# Patient Record
Sex: Male | Born: 1980 | Race: Black or African American | Hispanic: No | Marital: Single | Smoking: Current every day smoker
Health system: Southern US, Community
[De-identification: ages and names within clinical notes are randomized; demographics above are authoritative.]

## PROBLEM LIST (undated history)

## (undated) DIAGNOSIS — F209 Schizophrenia, unspecified: Secondary | ICD-10-CM

---
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
## 2002-10-13 ENCOUNTER — Inpatient Hospital Stay (HOSPITAL_COMMUNITY): Admission: AC | Admit: 2002-10-13 | Discharge: 2002-10-14 | Payer: Self-pay

## 2002-10-13 ENCOUNTER — Encounter: Payer: Self-pay | Admitting: General Surgery

## 2003-12-16 ENCOUNTER — Emergency Department (HOSPITAL_COMMUNITY): Admission: EM | Admit: 2003-12-16 | Discharge: 2003-12-17 | Payer: Self-pay | Admitting: Emergency Medicine

## 2004-04-26 ENCOUNTER — Emergency Department (HOSPITAL_COMMUNITY): Admission: AC | Admit: 2004-04-26 | Discharge: 2004-04-26 | Payer: Self-pay | Admitting: Emergency Medicine

## 2005-04-24 ENCOUNTER — Emergency Department (HOSPITAL_COMMUNITY): Admission: EM | Admit: 2005-04-24 | Discharge: 2005-04-24 | Payer: Self-pay | Admitting: Family Medicine

## 2005-08-02 ENCOUNTER — Emergency Department (HOSPITAL_COMMUNITY): Admission: EM | Admit: 2005-08-02 | Discharge: 2005-08-02 | Payer: Self-pay | Admitting: Emergency Medicine

## 2006-04-02 ENCOUNTER — Emergency Department (HOSPITAL_COMMUNITY): Admission: EM | Admit: 2006-04-02 | Discharge: 2006-04-02 | Payer: Self-pay | Admitting: Emergency Medicine

## 2006-04-04 ENCOUNTER — Emergency Department (HOSPITAL_COMMUNITY): Admission: EM | Admit: 2006-04-04 | Discharge: 2006-04-05 | Payer: Self-pay | Admitting: Emergency Medicine

## 2006-04-07 ENCOUNTER — Emergency Department (HOSPITAL_COMMUNITY): Admission: EM | Admit: 2006-04-07 | Discharge: 2006-04-08 | Payer: Self-pay | Admitting: Emergency Medicine

## 2006-10-21 ENCOUNTER — Emergency Department (HOSPITAL_COMMUNITY): Admission: EM | Admit: 2006-10-21 | Discharge: 2006-10-22 | Payer: Self-pay | Admitting: Emergency Medicine

## 2007-10-16 ENCOUNTER — Emergency Department (HOSPITAL_COMMUNITY): Admission: EM | Admit: 2007-10-16 | Discharge: 2007-10-16 | Payer: Self-pay | Admitting: Emergency Medicine

## 2009-01-23 ENCOUNTER — Emergency Department (HOSPITAL_COMMUNITY): Admission: EM | Admit: 2009-01-23 | Discharge: 2009-01-24 | Payer: Self-pay | Admitting: Emergency Medicine

## 2009-01-26 ENCOUNTER — Emergency Department (HOSPITAL_BASED_OUTPATIENT_CLINIC_OR_DEPARTMENT_OTHER): Admission: EM | Admit: 2009-01-26 | Discharge: 2009-01-26 | Payer: Self-pay | Admitting: Emergency Medicine

## 2009-10-27 ENCOUNTER — Other Ambulatory Visit: Payer: Self-pay

## 2009-10-28 ENCOUNTER — Ambulatory Visit: Payer: Self-pay | Admitting: Psychiatry

## 2009-10-28 ENCOUNTER — Inpatient Hospital Stay (HOSPITAL_COMMUNITY): Admission: AD | Admit: 2009-10-28 | Discharge: 2009-11-01 | Payer: Self-pay | Admitting: Psychiatry

## 2009-10-28 DIAGNOSIS — F29 Unspecified psychosis not due to a substance or known physiological condition: Secondary | ICD-10-CM

## 2010-08-08 LAB — RAPID URINE DRUG SCREEN, HOSP PERFORMED
Amphetamines: NOT DETECTED
Barbiturates: NOT DETECTED
Benzodiazepines: NOT DETECTED
Cocaine: POSITIVE — AB
Opiates: NOT DETECTED
Tetrahydrocannabinol: NOT DETECTED

## 2010-08-08 LAB — DIFFERENTIAL
Basophils Absolute: 0 10*3/uL (ref 0.0–0.1)
Basophils Relative: 0 % (ref 0–1)
Eosinophils Absolute: 0.1 10*3/uL (ref 0.0–0.7)
Eosinophils Relative: 1 % (ref 0–5)
Lymphocytes Relative: 31 % (ref 12–46)
Lymphs Abs: 2 10*3/uL (ref 0.7–4.0)
Monocytes Absolute: 0.7 10*3/uL (ref 0.1–1.0)
Monocytes Relative: 11 % (ref 3–12)
Neutro Abs: 3.7 10*3/uL (ref 1.7–7.7)
Neutrophils Relative %: 58 % (ref 43–77)

## 2010-08-08 LAB — BASIC METABOLIC PANEL
BUN: 8 mg/dL (ref 6–23)
CO2: 24 mEq/L (ref 19–32)
Calcium: 9.2 mg/dL (ref 8.4–10.5)
Chloride: 107 mEq/L (ref 96–112)
Creatinine, Ser: 1.03 mg/dL (ref 0.4–1.5)
GFR calc Af Amer: >60 mL/min (ref >60)
GFR calc non Af Amer: >60 mL/min (ref >60)
Glucose, Bld: 102 mg/dL — ABNORMAL HIGH (ref 70–99)
Potassium: 3.7 mEq/L (ref 3.5–5.1)
Sodium: 137 mEq/L (ref 135–145)

## 2010-08-08 LAB — CBC
HCT: 40.8 % (ref 39.0–52.0)
Hemoglobin: 14.1 g/dL (ref 13.0–17.0)
MCHC: 34.5 g/dL (ref 30.0–36.0)
MCV: 96.6 fL (ref 78.0–100.0)
Platelets: 177 10*3/uL (ref 150–400)
RBC: 4.22 MIL/uL (ref 4.22–5.81)
RDW: 11.9 % (ref 11.5–15.5)
WBC: 6.4 10*3/uL (ref 4.0–10.5)

## 2010-08-08 LAB — TRICYCLICS SCREEN, URINE: TCA Scrn: NOT DETECTED

## 2010-08-08 LAB — TSH: TSH: 0.541 u[IU]/mL (ref 0.350–4.500)

## 2010-08-08 LAB — ETHANOL: Alcohol, Ethyl (B): <5 mg/dL (ref 0–10)

## 2010-08-26 LAB — POCT TOXICOLOGY PANEL
Cocaine: POSITIVE
Tetrahydrocannabinol: POSITIVE

## 2010-08-26 LAB — URINALYSIS, ROUTINE W REFLEX MICROSCOPIC
Glucose, UA: NEGATIVE mg/dL
Hgb urine dipstick: NEGATIVE
Ketones, ur: 40 mg/dL — AB
Nitrite: NEGATIVE
Protein, ur: NEGATIVE mg/dL
Specific Gravity, Urine: 1.033 — ABNORMAL HIGH (ref 1.005–1.030)
Urobilinogen, UA: 1 mg/dL (ref 0.0–1.0)
pH: 5.5 (ref 5.0–8.0)

## 2010-08-26 LAB — COMPREHENSIVE METABOLIC PANEL
ALT: 16 U/L (ref 0–53)
ALT: 20 U/L (ref 0–53)
AST: 29 U/L (ref 0–37)
AST: 32 U/L (ref 0–37)
Albumin: 3.9 g/dL (ref 3.5–5.2)
Albumin: 4.5 g/dL (ref 3.5–5.2)
Alkaline Phosphatase: 40 U/L (ref 39–117)
Alkaline Phosphatase: 72 U/L (ref 39–117)
BUN: 14 mg/dL (ref 6–23)
BUN: 15 mg/dL (ref 6–23)
CO2: 24 mEq/L (ref 19–32)
CO2: 27 mEq/L (ref 19–32)
Calcium: 8.7 mg/dL (ref 8.4–10.5)
Calcium: 9.7 mg/dL (ref 8.4–10.5)
Chloride: 106 mEq/L (ref 96–112)
Chloride: 106 mEq/L (ref 96–112)
Creatinine, Ser: 1.11 mg/dL (ref 0.4–1.5)
Creatinine, Ser: 1.1 mg/dL (ref 0.4–1.5)
GFR calc Af Amer: >60 mL/min (ref >60)
GFR calc Af Amer: >60 mL/min (ref >60)
GFR calc non Af Amer: >60 mL/min (ref >60)
GFR calc non Af Amer: >60 mL/min (ref >60)
Glucose, Bld: 115 mg/dL — ABNORMAL HIGH (ref 70–99)
Glucose, Bld: 90 mg/dL (ref 70–99)
Potassium: 3.4 mEq/L — ABNORMAL LOW (ref 3.5–5.1)
Potassium: 3.4 mEq/L — ABNORMAL LOW (ref 3.5–5.1)
Sodium: 137 mEq/L (ref 135–145)
Sodium: 143 mEq/L (ref 135–145)
Total Bilirubin: 0.6 mg/dL (ref 0.3–1.2)
Total Bilirubin: 0.7 mg/dL (ref 0.3–1.2)
Total Protein: 6.6 g/dL (ref 6.0–8.3)
Total Protein: 7.9 g/dL (ref 6.0–8.3)

## 2010-08-26 LAB — CBC
HCT: 45.2 % (ref 39.0–52.0)
Hemoglobin: 15.5 g/dL (ref 13.0–17.0)
MCHC: 34.2 g/dL (ref 30.0–36.0)
MCV: 96.3 fL (ref 78.0–100.0)
Platelets: 188 10*3/uL (ref 150–400)
RBC: 4.69 MIL/uL (ref 4.22–5.81)
RDW: 11.7 % (ref 11.5–15.5)
WBC: 6.6 10*3/uL (ref 4.0–10.5)

## 2010-08-26 LAB — DIFFERENTIAL
Basophils Absolute: 0.1 10*3/uL (ref 0.0–0.1)
Basophils Relative: 1 % (ref 0–1)
Eosinophils Absolute: 0.1 10*3/uL (ref 0.0–0.7)
Eosinophils Relative: 1 % (ref 0–5)
Lymphocytes Relative: 21 % (ref 12–46)
Lymphs Abs: 1.4 10*3/uL (ref 0.7–4.0)
Monocytes Absolute: 1.1 10*3/uL — ABNORMAL HIGH (ref 0.1–1.0)
Monocytes Relative: 17 % — ABNORMAL HIGH (ref 3–12)
Neutro Abs: 3.9 10*3/uL (ref 1.7–7.7)
Neutrophils Relative %: 60 % (ref 43–77)

## 2010-08-26 LAB — RAPID URINE DRUG SCREEN, HOSP PERFORMED
Amphetamines: NOT DETECTED
Barbiturates: NOT DETECTED
Benzodiazepines: NOT DETECTED
Cocaine: POSITIVE — AB
Opiates: NOT DETECTED
Tetrahydrocannabinol: POSITIVE — AB

## 2010-08-26 LAB — ETHANOL
Alcohol, Ethyl (B): <5 mg/dL (ref 0–10)
Alcohol, Ethyl (B): <5 mg/dL (ref 0–10)

## 2010-08-26 LAB — POCT CARDIAC MARKERS
CKMB, poc: <1 ng/mL — ABNORMAL LOW (ref 1.0–8.0)
Myoglobin, poc: 100 ng/mL (ref 12–200)
Troponin i, poc: <0.05 ng/mL (ref 0.00–0.09)

## 2010-08-26 LAB — ACETAMINOPHEN LEVEL: Acetaminophen (Tylenol), Serum: <10 ug/mL — ABNORMAL LOW (ref 10–30)

## 2010-08-26 LAB — URINE MICROSCOPIC-ADD ON

## 2010-08-26 LAB — SALICYLATE LEVEL: Salicylate Lvl: <4 mg/dL (ref 2.8–20.0)

## 2010-10-25 ENCOUNTER — Inpatient Hospital Stay (INDEPENDENT_AMBULATORY_CARE_PROVIDER_SITE_OTHER)
Admission: RE | Admit: 2010-10-25 | Discharge: 2010-10-25 | Disposition: A | Discharge status: Home / Self Care | Payer: Medicaid Other | Source: Ambulatory Visit | Attending: Family Medicine | Admitting: Family Medicine

## 2010-10-25 DIAGNOSIS — A64 Unspecified sexually transmitted disease: Secondary | ICD-10-CM

## 2010-10-25 LAB — POCT URINALYSIS DIP (DEVICE)
Bilirubin Urine: NEGATIVE
Glucose, UA: NEGATIVE mg/dL
Hgb urine dipstick: NEGATIVE
Ketones, ur: NEGATIVE mg/dL
Nitrite: NEGATIVE
Protein, ur: NEGATIVE mg/dL
Specific Gravity, Urine: 1.025 (ref 1.005–1.030)
Urobilinogen, UA: 0.2 mg/dL (ref 0.0–1.0)
pH: 6.5 (ref 5.0–8.0)

## 2010-10-26 LAB — GC/CHLAMYDIA PROBE AMP, GENITAL
Chlamydia, DNA Probe: POSITIVE — AB
GC Probe Amp, Genital: NEGATIVE

## 2011-02-15 LAB — COMPREHENSIVE METABOLIC PANEL
Alkaline Phosphatase: 40
BUN: 21
CO2: 25
Chloride: 100
Creatinine, Ser: 1.06
GFR calc non Af Amer: >60
Glucose, Bld: 102 — ABNORMAL HIGH
Potassium: 3.8
Total Bilirubin: 1.5 — ABNORMAL HIGH

## 2011-02-15 LAB — RAPID URINE DRUG SCREEN, HOSP PERFORMED
Amphetamines: NOT DETECTED
Benzodiazepines: NOT DETECTED
Opiates: NOT DETECTED
Tetrahydrocannabinol: POSITIVE — AB

## 2011-02-15 LAB — DIFFERENTIAL
Basophils Absolute: 0.1
Basophils Relative: 1
Lymphocytes Relative: 17
Monocytes Absolute: 1
Neutro Abs: 9.6 — ABNORMAL HIGH
Neutrophils Relative %: 75

## 2011-02-15 LAB — CBC
HCT: 45.5
Hemoglobin: 15.9
MCV: 92.8
Platelets: 197
RBC: 4.9
WBC: 12.9 — ABNORMAL HIGH

## 2011-02-15 LAB — URINALYSIS, ROUTINE W REFLEX MICROSCOPIC
Nitrite: NEGATIVE
Protein, ur: 30 — AB
Specific Gravity, Urine: 1.037 — ABNORMAL HIGH
Urobilinogen, UA: 1

## 2011-02-15 LAB — URINE MICROSCOPIC-ADD ON

## 2011-02-15 LAB — ETHANOL: Alcohol, Ethyl (B): 5

## 2011-03-09 LAB — TRICYCLICS SCREEN, URINE: TCA Scrn: NOT DETECTED

## 2011-03-09 LAB — URINALYSIS, ROUTINE W REFLEX MICROSCOPIC
Glucose, UA: NEGATIVE
Nitrite: NEGATIVE
Specific Gravity, Urine: 1.037 — ABNORMAL HIGH
pH: 5.5

## 2011-03-09 LAB — CBC
MCHC: 33.7
MCV: 93.5
Platelets: 211
RBC: 4.58
WBC: 9.7

## 2011-03-09 LAB — URINE CULTURE
Colony Count: NO GROWTH
Culture: NO GROWTH

## 2011-03-09 LAB — COMPREHENSIVE METABOLIC PANEL
ALT: 15
AST: 34
Albumin: 4.6
Calcium: 9.5
Chloride: 107
Creatinine, Ser: 1.18
GFR calc Af Amer: >60
Sodium: 141

## 2011-03-09 LAB — DIFFERENTIAL
Eosinophils Absolute: 0.1
Eosinophils Relative: 1
Lymphocytes Relative: 24
Lymphs Abs: 2.3
Monocytes Absolute: 1.1 — ABNORMAL HIGH

## 2011-03-09 LAB — RAPID URINE DRUG SCREEN, HOSP PERFORMED
Barbiturates: NOT DETECTED
Benzodiazepines: POSITIVE — AB
Cocaine: POSITIVE — AB

## 2011-03-09 LAB — URINE MICROSCOPIC-ADD ON

## 2011-06-06 ENCOUNTER — Emergency Department (HOSPITAL_COMMUNITY)
Admission: EM | Admit: 2011-06-06 | Discharge: 2011-06-06 | Disposition: A | Discharge status: Home / Self Care | Payer: Medicaid Other | Attending: Emergency Medicine | Admitting: Emergency Medicine

## 2011-06-06 ENCOUNTER — Encounter (HOSPITAL_COMMUNITY): Payer: Self-pay | Admitting: *Deleted

## 2011-06-06 DIAGNOSIS — Z202 Contact with and (suspected) exposure to infections with a predominantly sexual mode of transmission: Secondary | ICD-10-CM | POA: Insufficient documentation

## 2011-06-06 DIAGNOSIS — A630 Anogenital (venereal) warts: Secondary | ICD-10-CM | POA: Insufficient documentation
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
## 2011-06-06 NOTE — ED Notes (Signed)
States noticing white d/c from penis this am. States that girlfriend was tested positive for hpv and genital warts. Pt concerned about lesions present for several months located on groin and thigh. Denies pain.

## 2011-06-06 NOTE — ED Provider Notes (Signed)
History     CSN: [id number]  Arrival date & time 06/06/11  1335   First MD Initiated Contact with Patient 06/06/11 1614      Chief Complaint  Patient presents with  . Exposure to STD    (Consider location/radiation/quality/duration/timing/severity/associated sxs/prior treatment) Patient is a 31 y.o. male presenting with STD exposure. The history is provided by the patient. No language interpreter was used.  Exposure to STD This is a new problem. The current episode started 1 to 4 weeks ago. The problem occurs daily. The problem has been gradually worsening. The symptoms are aggravated by nothing. He has tried nothing for the symptoms.   Patient reports that he had unprotected sex with a contact that has since been diagnosed with genital herpes.  Patient has several painless lesions in pubic area that he wants evaluated. Patient denies dysuria or penile discharge. History reviewed. No pertinent past medical history.  History reviewed. No pertinent past surgical history.  No family history on file.  History  Substance Use Topics  . Smoking status: Current Everyday Smoker  . Smokeless tobacco: Not on file  . Alcohol Use: Yes     occ      Review of Systems  Genitourinary: Positive for genital sores. Negative for dysuria, frequency, discharge, scrotal swelling, penile pain and testicular pain.  All other systems reviewed and are negative.    Allergies  Review of patient's allergies indicates no known allergies.  Home Medications   Current Outpatient Rx  Name Route Sig Dispense Refill  . ARIPIPRAZOLE 10 MG PO TABS Oral Take 10 mg by mouth daily.    . CHLORPROMAZINE HCL 200 MG PO TABS Oral Take 200 mg by mouth daily as needed.       BP 132/72  Pulse 67  Temp(Src) 97.7 F (36.5 C) (Oral)  Resp 20  SpO2 97%  Physical Exam  Nursing note and vitals reviewed. Constitutional: He is oriented to person, place, and time. He appears well-developed and well-nourished.    HENT:  Head: Normocephalic.  Eyes: Conjunctivae are normal. Pupils are equal, round, and reactive to light.  Neck: Normal range of motion. Neck supple.  Cardiovascular: Normal rate, regular rhythm, normal heart sounds and intact distal pulses.   Pulmonary/Chest: Effort normal and breath sounds normal.  Abdominal: Soft. Bowel sounds are normal.  Genitourinary: Testes normal and penis normal.     Musculoskeletal: Normal range of motion.  Neurological: He is alert and oriented to person, place, and time.  Skin: Skin is warm and dry.    ED Course  Procedures (including critical care time)  Labs Reviewed - No data to display No results found.   1. Genital warts       MDM          Jimmye Norman, NP 06/06/11 2202

## 2011-06-07 NOTE — ED Provider Notes (Signed)
Medical screening examination/treatment/procedure(s) were performed by non-physician practitioner and as supervising physician I was immediately available for consultation/collaboration.   Drevion Offord W Clinton Wahlberg, MD 06/07/11 1231 

## 2011-08-28 ENCOUNTER — Encounter (HOSPITAL_COMMUNITY): Payer: Self-pay

## 2011-08-28 ENCOUNTER — Emergency Department (INDEPENDENT_AMBULATORY_CARE_PROVIDER_SITE_OTHER)
Admission: EM | Admit: 2011-08-28 | Discharge: 2011-08-28 | Disposition: A | Discharge status: Home / Self Care | Payer: Medicaid Other | Source: Home / Self Care

## 2011-08-28 DIAGNOSIS — Z711 Person with feared health complaint in whom no diagnosis is made: Secondary | ICD-10-CM

## 2011-08-28 LAB — RPR: RPR Ser Ql: NONREACTIVE

## 2011-08-28 LAB — HIV ANTIBODY (ROUTINE TESTING W REFLEX): HIV: NONREACTIVE

## 2011-08-28 NOTE — ED Notes (Signed)
Patient concerned about poss STD; "condom popped"

## 2011-08-28 NOTE — Discharge Instructions (Signed)
You will be notified if your test results are abnormal. You will be treated if your tests show that you have an infection. Always use condoms. No sexual activity until you have been notified of your test results.

## 2011-08-29 LAB — GC/CHLAMYDIA PROBE AMP, GENITAL
Chlamydia, DNA Probe: NEGATIVE
GC Probe Amp, Genital: NEGATIVE

## 2011-08-30 ENCOUNTER — Telehealth (HOSPITAL_COMMUNITY): Payer: Self-pay | Admitting: *Deleted

## 2011-08-30 NOTE — ED Notes (Signed)
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 Lab shown to [employer] PA. She said to call pt. 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 I called pt.  Pt. verified x 2 and given results except HIV. Pt. told he would have to come to the [facility] M-F [id number] with a picture ID to get that result. Pt. instructed to notify his partner if he has had unprotected sex with them, because he could have passed it to them. You can pass the virus even when you don't have an outbreak, so always practice safe sex. Get treated for each outbreak with Acyclovir or Valtrex.  Vassie Moselle 08/30/2011

## 2017-11-29 ENCOUNTER — Emergency Department (HOSPITAL_COMMUNITY)
Admission: EM | Admit: 2017-11-29 | Discharge: 2017-11-30 | Disposition: A | Discharge status: Home / Self Care | Payer: Medicaid Other | Attending: Emergency Medicine | Admitting: Emergency Medicine

## 2017-11-29 ENCOUNTER — Other Ambulatory Visit: Payer: Self-pay

## 2017-11-29 DIAGNOSIS — F209 Schizophrenia, unspecified: Secondary | ICD-10-CM | POA: Insufficient documentation

## 2017-11-29 DIAGNOSIS — R441 Visual hallucinations: Secondary | ICD-10-CM | POA: Diagnosis present

## 2017-11-29 DIAGNOSIS — F1910 Other psychoactive substance abuse, uncomplicated: Secondary | ICD-10-CM | POA: Diagnosis not present

## 2017-11-29 DIAGNOSIS — R45851 Suicidal ideations: Secondary | ICD-10-CM | POA: Diagnosis not present

## 2017-11-29 DIAGNOSIS — F1419 Cocaine abuse with unspecified cocaine-induced disorder: Secondary | ICD-10-CM | POA: Diagnosis present

## 2017-11-29 DIAGNOSIS — F17210 Nicotine dependence, cigarettes, uncomplicated: Secondary | ICD-10-CM | POA: Diagnosis not present

## 2017-11-29 LAB — URINALYSIS, ROUTINE W REFLEX MICROSCOPIC
BACTERIA UA: NONE SEEN
Bilirubin Urine: NEGATIVE
Glucose, UA: NEGATIVE mg/dL
Hgb urine dipstick: NEGATIVE
Ketones, ur: 5 mg/dL — AB
NITRITE: NEGATIVE
PH: 5 (ref 5.0–8.0)
Protein, ur: 30 mg/dL — AB
SPECIFIC GRAVITY, URINE: 1.032 — AB (ref 1.005–1.030)

## 2017-11-29 LAB — SALICYLATE LEVEL: Salicylate Lvl: <7 mg/dL (ref 2.8–30.0)

## 2017-11-29 LAB — RAPID URINE DRUG SCREEN, HOSP PERFORMED
AMPHETAMINES: NOT DETECTED
BENZODIAZEPINES: NOT DETECTED
COCAINE: POSITIVE — AB
OPIATES: NOT DETECTED
Tetrahydrocannabinol: POSITIVE — AB

## 2017-11-29 LAB — ETHANOL: Alcohol, Ethyl (B): <10 mg/dL (ref <10)

## 2017-11-29 LAB — CBC WITH DIFFERENTIAL/PLATELET
BASOS PCT: 0 %
Basophils Absolute: 0 10*3/uL (ref 0.0–0.1)
Eosinophils Absolute: 0 10*3/uL (ref 0.0–0.7)
Eosinophils Relative: 0 %
HEMATOCRIT: 43.3 % (ref 39.0–52.0)
Hemoglobin: 14.7 g/dL (ref 13.0–17.0)
LYMPHS ABS: 1.7 10*3/uL (ref 0.7–4.0)
Lymphocytes Relative: 23 %
MCH: 32.4 pg (ref 26.0–34.0)
MCHC: 33.9 g/dL (ref 30.0–36.0)
MCV: 95.4 fL (ref 78.0–100.0)
MONO ABS: 0.9 10*3/uL (ref 0.1–1.0)
MONOS PCT: 13 %
NEUTROS ABS: 4.6 10*3/uL (ref 1.7–7.7)
Neutrophils Relative %: 64 %
Platelets: 221 10*3/uL (ref 150–400)
RBC: 4.54 MIL/uL (ref 4.22–5.81)
RDW: 12.3 % (ref 11.5–15.5)
WBC: 7.3 10*3/uL (ref 4.0–10.5)

## 2017-11-29 LAB — COMPREHENSIVE METABOLIC PANEL
ALBUMIN: 4.3 g/dL (ref 3.5–5.0)
ALK PHOS: 39 U/L (ref 38–126)
ALT: 19 U/L (ref 0–44)
AST: 22 U/L (ref 15–41)
Anion gap: 9 (ref 5–15)
BILIRUBIN TOTAL: 1 mg/dL (ref 0.3–1.2)
BUN: 15 mg/dL (ref 6–20)
CO2: 25 mmol/L (ref 22–32)
Calcium: 9.3 mg/dL (ref 8.9–10.3)
Chloride: 108 mmol/L (ref 98–111)
Creatinine, Ser: 1.2 mg/dL (ref 0.61–1.24)
GFR calc Af Amer: >60 mL/min (ref >60)
GFR calc non Af Amer: >60 mL/min (ref >60)
GLUCOSE: 121 mg/dL — AB (ref 70–99)
POTASSIUM: 3.3 mmol/L — AB (ref 3.5–5.1)
Sodium: 142 mmol/L (ref 135–145)
TOTAL PROTEIN: 7.6 g/dL (ref 6.5–8.1)

## 2017-11-29 LAB — ACETAMINOPHEN LEVEL: Acetaminophen (Tylenol), Serum: <10 ug/mL — ABNORMAL LOW (ref 10–30)

## 2017-11-29 NOTE — ED Notes (Signed)
Pt to room # 43. Pt guarded, forwards little with this nurse, irritable at times. Encouragement and support provided. Dinner provided. Special checks q 15 mins in place for safety, video monitoring in place. Will continue to monitor.

## 2017-11-29 NOTE — ED Notes (Signed)
[id number] Expected date:  Expected time:  Means of arrival:  Comments: 3

## 2017-11-29 NOTE — Progress Notes (Signed)
TTS consulted with Kristopher ConnJason Berry, NP who recommends continued observation for safety and stabilization and to be reassessed in the AM by psych. EDP [name], [name], PA-C and pt's nurse [name], [name], RN have been advised of the disposition.   Princess BruinsAquicha Amaranta Mehl, MSW, LCSW Therapeutic Triage Specialist  [phone]
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
## 2017-11-29 NOTE — ED Notes (Signed)
Pt A&O x 3, guarded behavior with staff. Calm at present.  Monitoring for safety, Q 15 min checks in effect. Pt SI & HI.

## 2017-11-29 NOTE — ED Triage Notes (Signed)
Pt states he is off his medications for approx. 3-5 days. Pt admits to SI and HI. Violent towards his mother however does not give specifics. Pt admits to visual and auditory hallucinations.

## 2017-11-29 NOTE — ED Provider Notes (Addendum)
[facility]-EMERGENCY DEPT Provider Note   CSN: [id number] Arrival date & time: 11/29/17  1612     History   Chief Complaint Chief Complaint  Patient presents with  . Suicidal  . Hallucinations    HPI Kristopher Miller is a 37 y.o. male.  37 year old male presents for visual hallucinations, patient states he has schizophrenia and needs a checkup.  Patient states he normally gets his medications from [facility], unable to say the last time that he was seen at [facility] or given his medications.  Patient reports seeing "crazy shit," unable to say anything else.  Patient reports suicidal ideations with plans to hit himself.  Denies homicidal ideation, auditory hallucinations.  Denies drug or alcohol use.  No other complaints or concerns today.     No past medical history on file.  Patient Active Problem List   Diagnosis Date Noted  . 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      Home Medications    Prior to Admission medications   Medication Sig Start Date End Date Taking? Authorizing Provider  cariprazine (VRAYLAR) capsule Take 1.5 mg by mouth daily.   Yes [provider]  hydrOXYzine (ATARAX/VISTARIL) 10 MG tablet Take 10 mg by mouth 3 (three) times daily as needed for anxiety.   Yes [provider]    Family History No family history on file.  Social History Social History   Tobacco Use  . Smoking status: Current Every Day Smoker  Substance Use Topics  . Alcohol use: Yes    Comment: occ  . Drug use: Yes    Types: Marijuana     Allergies   Patient has no known allergies.   Review of Systems Review of Systems  Constitutional: Negative for fever.  Respiratory: Negative for cough.   Cardiovascular: Negative for chest pain.  Gastrointestinal: Negative for abdominal pain.  Genitourinary: Negative for difficulty urinating.  Skin: Negative for rash and wound.    Allergic/Immunologic: Negative for immunocompromised state.  Hematological: Does not bruise/bleed easily.  Psychiatric/Behavioral: Positive for hallucinations and suicidal ideas. Negative for agitation, behavioral problems and confusion.  All other systems reviewed and are negative.    Physical Exam Updated Vital Signs BP 121/70 (BP Location: Right Arm)   Pulse (!) 105   Temp 98.6 F (37 C) (Oral)   Resp 19   Ht 5\' 11"  (1.803 m)   Wt 77.1 kg (170 lb)   SpO2 96%   BMI 23.71 kg/m   Physical Exam  Constitutional: He is oriented to person, place, and time. He appears well-developed and well-nourished.  HENT:  Head: Normocephalic and atraumatic.  Eyes: Conjunctivae are normal.  Neck: Neck supple.  Cardiovascular: Normal rate, regular rhythm, normal heart sounds and intact distal pulses.  No murmur heard. Pulmonary/Chest: Effort normal and breath sounds normal. No respiratory distress.  Abdominal: Soft. He exhibits no distension. There is no tenderness.  Neurological: He is alert and oriented to person, place, and time.  Skin: Skin is warm and dry. Capillary refill takes less than 2 seconds. No rash noted.  Psychiatric: He is slowed and withdrawn. He is not actively hallucinating. He expresses suicidal ideation. He expresses no homicidal ideation. He expresses suicidal plans. He expresses no homicidal plans.  Flat affect, mumbles, poor eye contact He is attentive.  Nursing note and vitals reviewed.    ED Treatments / Results  Labs (all labs ordered are listed, but only abnormal  results are displayed) Labs Reviewed  ACETAMINOPHEN LEVEL - Abnormal; Notable for the following components:      Result Value   Acetaminophen (Tylenol), Serum <10 (*)    All other components within normal limits  COMPREHENSIVE METABOLIC PANEL - Abnormal; Notable for the following components:   Potassium 3.3 (*)    Glucose, Bld 121 (*)    All other components within normal limits  URINALYSIS,  ROUTINE W REFLEX MICROSCOPIC - Abnormal; Notable for the following components:   Color, Urine AMBER (*)    Specific Gravity, Urine 1.032 (*)    Ketones, ur 5 (*)    Protein, ur 30 (*)    Leukocytes, UA TRACE (*)    All other components within normal limits  RAPID URINE DRUG SCREEN, HOSP PERFORMED - Abnormal; Notable for the following components:   Cocaine POSITIVE (*)    Tetrahydrocannabinol POSITIVE (*)    Barbiturates   (*)    Value: Result not available. Reagent lot number recalled by manufacturer.   All other components within normal limits  ETHANOL  CBC WITH DIFFERENTIAL/PLATELET  SALICYLATE LEVEL    EKG EKG Interpretation  Date/Time:  Thursday November 29 2017 17:05:50 EDT Ventricular Rate:  73 PR Interval:  178 QRS Duration: 74 QT Interval:  380 QTC Calculation: 418 R Axis:   90 Text Interpretation:  Normal sinus rhythm Rightward axis Borderline ECG No significant change was found Confirmed by Azalia Bilisampos, Kevin ([id number]) on 11/29/2017 6:27:45 PM   Radiology No results found.  Procedures Procedures (including critical care time)  Medications Ordered in ED Medications  OLANZapine zydis (ZYPREXA) disintegrating tablet 5 mg (5 mg Oral Given 11/30/17 0426)  hydrOXYzine (ATARAX/VISTARIL) tablet 50 mg (50 mg Oral Given 11/30/17 0426)  LORazepam (ATIVAN) tablet 2 mg (2 mg Oral Given 11/30/17 0757)     Initial Impression / Assessment and Plan / ED Course  I have reviewed the triage vital signs and the nursing notes.  Pertinent labs & imaging results that were available during my care of the patient were reviewed by me and considered in my medical decision making (see chart for details).  Clinical Course as of Dec 01 2149  Thu Nov 29, 2017  1826 [facility]   [LM]  2106 Case discussed with behavioral health who plans to overnight observation and recheck in the morning.  Patient is not involuntarily committed, denied SI to behavioral health  provider, SI report to me during his history involved plan to hit himself.  Patient is agreeable to stay overnight.   [LM]    Clinical Course User Index [LM] [name], [name], PA-C   Final Clinical Impressions(s) / ED Diagnoses   Final diagnoses:  Polysubstance abuse (HCC)  Suicidal ideation  Visual hallucinations  Cocaine abuse with cocaine-induced disorder [facility](HCC)    ED Discharge Orders    None       [name], [name], PA-C 11/29/17 1827    [name], [name], PA-C 12/01/17 2151    Azalia Bilisampos, Kevin, MD 12/01/17 [phone]

## 2017-11-30 DIAGNOSIS — F1419 Cocaine abuse with unspecified cocaine-induced disorder: Secondary | ICD-10-CM | POA: Diagnosis present

## 2017-11-30 MED ORDER — OLANZAPINE 5 MG PO TBDP
5.0000 mg | ORAL_TABLET | Freq: Once | ORAL | Status: AC
  Administered 2017-11-30: 5 mg via ORAL
  Filled 2017-11-30: qty 1

## 2017-11-30 MED ORDER — COLCHICINE 0.6 MG PO TABS: 0.6000 mg | ORAL_TABLET | Freq: Once | ORAL | Status: DC

## 2017-11-30 MED ORDER — LORAZEPAM 1 MG PO TABS
2.0000 mg | ORAL_TABLET | Freq: Once | ORAL | Status: AC
  Administered 2017-11-30: 2 mg via ORAL
  Filled 2017-11-30: qty 2

## 2017-11-30 MED ORDER — HYDROXYZINE HCL 25 MG PO TABS
50.0000 mg | ORAL_TABLET | Freq: Once | ORAL | Status: AC
  Administered 2017-11-30: 50 mg via ORAL
  Filled 2017-11-30: qty 2

## 2017-11-30 NOTE — BH Assessment (Signed)
[facility] Assessment Progress Note  Per Kristopher QuarryHiren Umrania, MD, this pt does not require psychiatric hospitalization at this time.  [facility]  This has been included in pt's discharge instructions.  Pt would also benefit from seeing Peer Support Specialists; they will be asked to speak to pt.  Pt's nurse, Kristopher Sheldonshley, has been notified.  Kristopher Canninghomas Jaylenn Altier, MA Triage Specialist [phone]

## 2017-11-30 NOTE — ED Notes (Signed)
Pt continues to push at exit doors, sitting on floor of bathroom, redirected back to room.  Asked pt if he needed something for sleep and he stated yes he will take pills.  PA Nira ConnJason Berry called for orders.

## 2017-11-30 NOTE — Discharge Instructions (Signed)
For your mental health needs, you are advised to follow up with [facility].  New and returning patients are seen at their walk-in clinic.  Walk-in hours are Monday - Friday from 8:00 am - 3:00 pm.  Walk-in patients are seen on a first come, first served basis.  Try to arrive as early as possible for he best chance of being seen the same day: ° °     [facility] °     [address] °     [place], [place] [address] °     [phone] °

## 2017-11-30 NOTE — ED Notes (Signed)
Pt d/c home per MD order. Discharge summary reviewed with pt. Pt verbalizes understanding. Not endorsing SI/HI. Bus pass provided. Personal property returned. Pt signed e-signature. Ambulatory off unit with [facility].

## 2017-11-30 NOTE — ED Notes (Signed)
Pt attempting to exit out of locked doors, redirected back to room #43.

## 2017-11-30 NOTE — ED Notes (Signed)
Pt behavior bizarre, crawling on floor, anxious, requesting medication to help "head" this nurse notified EDP. Awaiting orders.

## 2017-11-30 NOTE — Consult Note (Signed)
[facility] Psych ED Discharge  11/30/2017 1:16 PM Kristopher Miller  MRN:  [id number] Principal Problem: Cocaine abuse with cocaine-induced disorder [facility]) Discharge Diagnoses:  Patient Active Problem List   Diagnosis Date Noted  . Cocaine abuse with cocaine-induced disorder [facility]) [F14.19] 11/30/2017    Subjective: Pt was seen and chart reviewed with treatment team and Dr Jerold Coombe. Pt denies suicidal/homicidal ideation, denies auditory/visual hallucinations and does not appear to be responding to internal stimuli. Pt's UDS positive for cocaine and THC, BAL negative. Pt stated he used cocaine and his cousin kicked him out of the house so he came to the [facility]. Pt was groggy and mumbling so he was difficult to understand. Pt had been crawling on the floor so he was medicated prior to rounds this AM. Pt stated he takes Vistaril for his anxiety and he is followed at [facility]. Pt's labs are unremarkable. Pt's EKG reviewed and is WNL. No other diagnostic tests were performed. Pt currently has a court date on 12-03-2017 and he stated he knew about this and needed to be there. Pt has no history with this hospital since 2013. Pt does have a prison record and was incarcerated during the last few years. Pt is stable and psychiatrically clear for discharge.   Total Time spent with patient: 45 minutes  Past Psychiatric History: As above  Past Medical History: No past medical history on file. No past surgical history on file. Family History: No family history on file. Family Psychiatric  History: Unknown Social History:  Social History   Substance and Sexual Activity  Alcohol Use Yes   Comment: occ    Social History   Substance and Sexual Activity  Drug Use Yes  . Types: Marijuana   Social History   Socioeconomic History  . Marital status: Single    Spouse name: Not on file  . Number of children: Not on file  . Years of education: Not on file  . Highest education level: Not on file  Occupational History   . Not on file  Social Needs  . Financial resource strain: Not on file  . Food insecurity:    Worry: Not on file    Inability: Not on file  . Transportation needs:    Medical: Not on file    Non-medical: Not on file  Tobacco Use  . Smoking status: Current Every Day Smoker  Substance and Sexual Activity  . Alcohol use: Yes    Comment: occ  . Drug use: Yes    Types: Marijuana  . Sexual activity: Not on file  Lifestyle  . Physical activity:    Days per week: Not on file    Minutes per session: Not on file  . Stress: Not on file  Relationships  . Social connections:    Talks on phone: Not on file    Gets together: Not on file    Attends religious service: Not on file    Active member of club or organization: Not on file    Attends meetings of clubs or organizations: Not on file    Relationship status: Not on file  Other Topics Concern  . Not on file  Social History Narrative  . Not on file    Has this patient used any form of tobacco in the last 30 days? (Cigarettes, Smokeless Tobacco, Cigars, and/or Pipes) Prescription not provided because: Pt declined  Current Medications: No current facility-administered medications for this encounter.    Current Outpatient Medications  Medication Sig Dispense  Refill  . cariprazine (VRAYLAR) capsule Take 1.5 mg by mouth daily.    . hydrOXYzine (ATARAX/VISTARIL) 10 MG tablet Take 10 mg by mouth 3 (three) times daily as needed for anxiety.      Musculoskeletal: Strength & Muscle Tone: within normal limits Gait & Station: normal Patient leans: N/A  Psychiatric Specialty Exam: Physical Exam  Constitutional: He appears well-developed and well-nourished.  Respiratory: Effort normal.  Musculoskeletal: Normal range of motion.  Neurological: He is alert.  Psychiatric: He has a normal mood and affect. Thought content normal. His speech is slurred. He is slowed. Cognition and memory are normal. He expresses impulsivity.    Review of  Systems  Psychiatric/Behavioral: Positive for substance abuse. Negative for depression, hallucinations, memory loss and suicidal ideas. The patient is not nervous/anxious and does not have insomnia.   All other systems reviewed and are negative.   Blood pressure 116/64, pulse 65, temperature 98.2 F (36.8 C), temperature source Oral, resp. rate 14, height 5\' 11"  (1.803 m), weight 170 lb (77.1 kg), SpO2 97 %.Body mass index is 23.71 kg/m.  General Appearance: Casual  Eye Contact:  Poor  Speech:  Slow and Slurred  Volume:  Decreased  Mood:  Depressed and sleepy  Affect:  Congruent and Depressed  Thought Process:  Coherent, Linear and Descriptions of Associations: Intact  Orientation:  Full (Time, Place, and Person)  Thought Content:  Logical  Suicidal Thoughts:  No  Homicidal Thoughts:  No  Memory:  Immediate;   Good Recent;   Good Remote;   Fair  Judgement:  Fair  Insight:  Lacking  Psychomotor Activity:  Decreased  Concentration:  Concentration: Good and Attention Span: Good  Recall:  Good  Fund of Knowledge:  Good  Language:  Good  Akathisia:  No  Handed:  Right  AIMS (if indicated):     Assets:  ArchitectCommunication Skills Financial Resources/Insurance Housing  ADL's:  Intact  Cognition:  WNL  Sleep:        Demographic Factors:  Male, Low socioeconomic status and Unemployed  Loss Factors: Legal issues and Financial problems/change in socioeconomic status  Historical Factors: Impulsivity  Risk Reduction Factors:   Sense of responsibility to family and Living with another person, especially a relative  Continued Clinical Symptoms:  Depression:   Impulsivity Alcohol/Substance Abuse/Dependencies  Cognitive Features That Contribute To Risk:  Closed-mindedness    Suicide Risk:  Minimal: No identifiable suicidal ideation.  Patients presenting with no risk factors but with morbid ruminations; may be classified as minimal risk based on the severity of the depressive  symptoms    Plan Of Care/Follow-up recommendations:  Activity:  as tolerated Diet:  Heart Healthy  Disposition: Discharge Home [facility] your medication and therapy needs.  Chart reviewed Labs and test reviewed, unremarkable No new medications started in the emergency room Avoid the use of alcohol and illicit drugs.  Laveda AbbeLaurie Britton Autry Prust, NP 11/30/2017, 1:16 PM

## 2017-12-03 ENCOUNTER — Other Ambulatory Visit: Payer: Self-pay

## 2017-12-03 ENCOUNTER — Encounter (HOSPITAL_COMMUNITY): Payer: Self-pay | Admitting: Emergency Medicine

## 2017-12-03 ENCOUNTER — Emergency Department (HOSPITAL_COMMUNITY)
Admission: EM | Admit: 2017-12-03 | Discharge: 2017-12-04 | Disposition: A | Discharge status: Home / Self Care | Payer: Medicaid Other | Attending: Emergency Medicine | Admitting: Emergency Medicine

## 2017-12-03 DIAGNOSIS — F1410 Cocaine abuse, uncomplicated: Secondary | ICD-10-CM | POA: Diagnosis not present

## 2017-12-03 DIAGNOSIS — F4310 Post-traumatic stress disorder, unspecified: Secondary | ICD-10-CM | POA: Diagnosis not present

## 2017-12-03 DIAGNOSIS — Z79899 Other long term (current) drug therapy: Secondary | ICD-10-CM | POA: Insufficient documentation

## 2017-12-03 DIAGNOSIS — F1220 Cannabis dependence, uncomplicated: Secondary | ICD-10-CM | POA: Diagnosis not present

## 2017-12-03 DIAGNOSIS — F329 Major depressive disorder, single episode, unspecified: Secondary | ICD-10-CM | POA: Diagnosis present

## 2017-12-03 DIAGNOSIS — F251 Schizoaffective disorder, depressive type: Secondary | ICD-10-CM | POA: Insufficient documentation

## 2017-12-03 DIAGNOSIS — F1419 Cocaine abuse with unspecified cocaine-induced disorder: Secondary | ICD-10-CM

## 2017-12-03 DIAGNOSIS — F17200 Nicotine dependence, unspecified, uncomplicated: Secondary | ICD-10-CM | POA: Diagnosis not present

## 2017-12-03 DIAGNOSIS — Z008 Encounter for other general examination: Secondary | ICD-10-CM | POA: Diagnosis not present

## 2017-12-03 LAB — COMPREHENSIVE METABOLIC PANEL
ALBUMIN: 4.1 g/dL (ref 3.5–5.0)
ALK PHOS: 41 U/L (ref 38–126)
ALT: 20 U/L (ref 0–44)
AST: 26 U/L (ref 15–41)
Anion gap: 7 (ref 5–15)
BILIRUBIN TOTAL: 0.8 mg/dL (ref 0.3–1.2)
BUN: 16 mg/dL (ref 6–20)
CO2: 27 mmol/L (ref 22–32)
CREATININE: 1.18 mg/dL (ref 0.61–1.24)
Calcium: 9.1 mg/dL (ref 8.9–10.3)
Chloride: 109 mmol/L (ref 98–111)
GFR calc Af Amer: >60 mL/min (ref >60)
GFR calc non Af Amer: >60 mL/min (ref >60)
GLUCOSE: 130 mg/dL — AB (ref 70–99)
Potassium: 3.6 mmol/L (ref 3.5–5.1)
Sodium: 143 mmol/L (ref 135–145)
TOTAL PROTEIN: 7.2 g/dL (ref 6.5–8.1)

## 2017-12-03 LAB — CBC
HEMATOCRIT: 42.2 % (ref 39.0–52.0)
HEMOGLOBIN: 14.2 g/dL (ref 13.0–17.0)
MCH: 32.3 pg (ref 26.0–34.0)
MCHC: 33.6 g/dL (ref 30.0–36.0)
MCV: 95.9 fL (ref 78.0–100.0)
Platelets: 229 10*3/uL (ref 150–400)
RBC: 4.4 MIL/uL (ref 4.22–5.81)
RDW: 12.1 % (ref 11.5–15.5)
WBC: 6.2 10*3/uL (ref 4.0–10.5)

## 2017-12-03 LAB — SALICYLATE LEVEL: Salicylate Lvl: <7 mg/dL (ref 2.8–30.0)

## 2017-12-03 LAB — ETHANOL: Alcohol, Ethyl (B): <10 mg/dL (ref <10)

## 2017-12-03 LAB — RAPID URINE DRUG SCREEN, HOSP PERFORMED
Amphetamines: NOT DETECTED
Benzodiazepines: NOT DETECTED
Cocaine: POSITIVE — AB
Opiates: NOT DETECTED
Tetrahydrocannabinol: POSITIVE — AB

## 2017-12-03 LAB — ACETAMINOPHEN LEVEL: Acetaminophen (Tylenol), Serum: <10 ug/mL — ABNORMAL LOW (ref 10–30)

## 2017-12-03 MED ORDER — HYDROXYZINE HCL 10 MG PO TABS: 10.0000 mg | ORAL_TABLET | Freq: Three times a day (TID) | ORAL | Status: DC | PRN

## 2017-12-03 MED ORDER — NICOTINE 21 MG/24HR TD PT24
21.0000 mg | MEDICATED_PATCH | Freq: Every day | TRANSDERMAL | Status: DC
  Administered 2017-12-03: 21 mg via TRANSDERMAL
  Filled 2017-12-03: qty 1

## 2017-12-03 MED ORDER — ONDANSETRON HCL 4 MG PO TABS: 4.0000 mg | ORAL_TABLET | Freq: Three times a day (TID) | ORAL | Status: DC | PRN

## 2017-12-03 MED ORDER — HYDROXYZINE HCL 25 MG PO TABS
50.0000 mg | ORAL_TABLET | Freq: Once | ORAL | Status: AC
  Administered 2017-12-03: 50 mg via ORAL
  Filled 2017-12-03: qty 2

## 2017-12-03 MED ORDER — CARIPRAZINE HCL 1.5 MG PO CAPS
1.5000 mg | ORAL_CAPSULE | Freq: Every day | ORAL | Status: DC
  Administered 2017-12-03: 1.5 mg via ORAL
  Filled 2017-12-03 (×3): qty 1

## 2017-12-03 MED ORDER — OLANZAPINE 10 MG PO TABS
10.0000 mg | ORAL_TABLET | Freq: Once | ORAL | Status: AC
  Administered 2017-12-03: 10 mg via ORAL
  Filled 2017-12-03: qty 1

## 2017-12-03 MED ORDER — ACETAMINOPHEN 325 MG PO TABS: 650.0000 mg | ORAL_TABLET | ORAL | Status: DC | PRN

## 2017-12-03 NOTE — ED Notes (Addendum)
BELONGINGS IN LOCKER 29; CLOTHING, RED SNEAKERS, NEWPORTS, 2 LIGHTERS, LOTTERY TICKET, VRAYLAR BOTTLE AND BOTTLE OF HYDROXYZINE (PER PT).

## 2017-12-03 NOTE — ED Notes (Signed)
Pt A&O x 3, anxious and agitated, pacing.  Presents with Paranoia and SI.  Denies HI.  Monitoring for safety, Q 15 min checks in effect, no distress noted.  Pt offered PO meds for agitation,  Security and [employer] at bedside for assist.

## 2017-12-03 NOTE — ED Provider Notes (Signed)
[facility]-EMERGENCY DEPT Provider Note   CSN: [id number] Arrival date & time: 12/03/17  1621     History   Chief Complaint Chief Complaint  Patient presents with  . Medical Clearance    HPI Kristopher Miller is a 37 y.o. male with a history of cocaine abuse, reported recent history of diagnosis of schizophrenia who presents emergency department today for medical clearance.  Patient was reported to leave [facility] today after being admitted for suicidal ideation.  He reports that he has been having difficulty with family and he feels they are after him.  He reports he has been feeling down and at times wants to walk in front of a car to end things.  He reports that he does have homicidal ideation and since that if someone has come after him he would harm them.  He admits to cocaine use and marijuana use but nothing recently.  He notes some alcohol use yesterday.  He denies chronic alcohol use or daily alcohol use.  He denies any attempts of self-harm.  He denies any physical complaints at this time.  HPI  History reviewed. No pertinent past medical history.  Patient Active Problem List   Diagnosis Date Noted  . Cocaine abuse with cocaine-induced disorder (HCC) 11/30/2017    History reviewed. No pertinent surgical history.      Home Medications    Prior to Admission medications   Medication Sig Start Date End Date Taking? Authorizing Provider  cariprazine (VRAYLAR) capsule Take 1.5 mg by mouth at bedtime.   Yes [provider]  hydrOXYzine (ATARAX/VISTARIL) 10 MG tablet Take 10 mg by mouth 3 (three) times daily as needed for anxiety.   Yes [provider]    Family History History reviewed. No pertinent family history.  Social History Social History   Tobacco Use  . Smoking status: Current Every Day Smoker  . Smokeless tobacco: Never Used  Substance Use Topics  . Alcohol use: Yes    Comment: occ  . Drug use: Yes   Types: Marijuana     Allergies   Patient has no known allergies.   Review of Systems Review of Systems  All other systems reviewed and are negative.    Physical Exam Updated Vital Signs BP 130/84 (BP Location: Right Arm)   Temp 98.6 F (37 C) (Oral)   Resp 19   SpO2 100%   Physical Exam  Constitutional: He appears well-developed and well-nourished.  HENT:  Head: Normocephalic and atraumatic.  Right Ear: External ear normal.  Left Ear: External ear normal.  Nose: Nose normal.  Mouth/Throat: Uvula is midline, oropharynx is clear and moist and mucous membranes are normal. No tonsillar exudate.  Eyes: Pupils are equal, round, and reactive to light. Right eye exhibits no discharge. Left eye exhibits no discharge. No scleral icterus.  Neck: Trachea normal. Neck supple. No spinous process tenderness present. No neck rigidity. Normal range of motion present.  Cardiovascular: Normal rate, regular rhythm and intact distal pulses.  No murmur heard. Pulses:      Radial pulses are 2+ on the right side, and 2+ on the left side.       Dorsalis pedis pulses are 2+ on the right side, and 2+ on the left side.       Posterior tibial pulses are 2+ on the right side, and 2+ on the left side.  No lower extremity swelling or edema. Calves symmetric in size bilaterally.  Pulmonary/Chest: Effort normal and breath  sounds normal. He exhibits no tenderness.  Abdominal: Soft. Bowel sounds are normal. There is no tenderness. There is no rigidity, no rebound, no guarding and no CVA tenderness.  Musculoskeletal: He exhibits no edema.  Lymphadenopathy:    He has no cervical adenopathy.  Neurological: He is alert.  Skin: Skin is warm and dry. No rash noted. He is not diaphoretic.  Psychiatric: He has a normal mood and affect.  Nursing note and vitals reviewed.    ED Treatments / Results  Labs (all labs ordered are listed, but only abnormal results are displayed) Labs Reviewed  COMPREHENSIVE  METABOLIC PANEL - Abnormal; Notable for the following components:      Result Value   Glucose, Bld 130 (*)    All other components within normal limits  ACETAMINOPHEN LEVEL - Abnormal; Notable for the following components:   Acetaminophen (Tylenol), Serum <10 (*)    All other components within normal limits  RAPID URINE DRUG SCREEN, HOSP PERFORMED - Abnormal; Notable for the following components:   Cocaine POSITIVE (*)    Tetrahydrocannabinol POSITIVE (*)    Barbiturates   (*)    Value: Result not available. Reagent lot number recalled by manufacturer.   All other components within normal limits  ETHANOL  SALICYLATE LEVEL  CBC    EKG None  Radiology No results found.  Procedures Procedures (including critical care time)  Medications Ordered in ED Medications  nicotine (NICODERM CQ - dosed in mg/24 hours) patch 21 mg (21 mg Transdermal Patch Applied 12/03/17 1844)  acetaminophen (TYLENOL) tablet 650 mg (has no administration in time range)  ondansetron (ZOFRAN) tablet 4 mg (has no administration in time range)  cariprazine (VRAYLAR) capsule 1.5 mg (1.5 mg Oral Given 12/03/17 2036)  hydrOXYzine (ATARAX/VISTARIL) tablet 10 mg (has no administration in time range)  OLANZapine (ZYPREXA) tablet 10 mg (10 mg Oral Given 12/03/17 2036)  hydrOXYzine (ATARAX/VISTARIL) tablet 50 mg (50 mg Oral Given 12/03/17 2036)     Initial Impression / Assessment and Plan / ED Course  I have reviewed the triage vital signs and the nursing notes.  Pertinent labs & imaging results that were available during my care of the patient were reviewed by me and considered in my medical decision making (see chart for details).     37 y.o. male presenting with Si with a plan. He reports thoughts that others are out to get him and he admits to thoughts of harming others.  He denies any auditory or visual hallucinations.  Patient vital signs are reassuring on presentation.  He denies any physical complaints at  this time.  Exam is reassuring as well.  He denies any alcohol, drug or overdose today.  His lab work is reassuring.  Patient is medically cleared.  Home medications reordered.  Patient placed in psych hold.  TTS consulted.  TTS recommends overnight observation with reassessment in the morning.  Final Clinical Impressions(s) / ED Diagnoses   Final diagnoses:  Encounter for medical clearance for patient hold    ED Discharge Orders    None       Princella PellegriniMaczis, Paislynn Hegstrom M, PA-C 12/03/17 2319    Tegeler, Canary Brimhristopher J, MD 12/04/17 [phone]
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
## 2017-12-03 NOTE — ED Notes (Signed)
Pt punching walls and door, very paranoid, anxious, [facility] about [facility] his face he states.  Security and [employer] at bedside for assistance.  PA Nira ConnJason Berry called for orders.

## 2017-12-03 NOTE — ED Notes (Signed)
TTS assessment in progress. 

## 2017-12-03 NOTE — ED Notes (Signed)
[id number] Expected date:  Expected time:  Means of arrival:  Comments: rm 29
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
## 2017-12-04 DIAGNOSIS — F1419 Cocaine abuse with unspecified cocaine-induced disorder: Secondary | ICD-10-CM

## 2017-12-04 NOTE — [facility] Suicide Risk Assessment (Signed)
Suicide Risk Assessment  Discharge Assessment   [facility]   Principal Problem: Cocaine abuse with cocaine-induced disorder [facility](HCC) Discharge Diagnoses:  Patient Active Problem List   Diagnosis Date Noted  . Cocaine abuse with cocaine-induced disorder [facility](HCC) [F14.19] 11/30/2017    Priority: High    Total Time spent with patient: 45 minutes   Musculoskeletal: Strength & Muscle Tone: within normal limits Gait & Station: normal Patient leans: N/A  Psychiatric Specialty Exam: Physical Exam  Nursing note and vitals reviewed. Constitutional: He is oriented to person, place, and time. He appears well-developed and well-nourished.  HENT:  Head: Normocephalic.  Neck: Normal range of motion.  Respiratory: Effort normal.  Musculoskeletal: Normal range of motion.  Neurological: He is alert and oriented to person, place, and time.  Psychiatric: His speech is normal. Judgment and thought content normal. His mood appears anxious. He is agitated. Cognition and memory are normal.    Review of Systems  Psychiatric/Behavioral: Positive for substance abuse. The patient is nervous/anxious.   All other systems reviewed and are negative.   Blood pressure (!) 134/98, pulse (!) 52, temperature 98 F (36.7 C), temperature source Oral, resp. rate 16, height 5\' 10"  (1.778 m), weight 77.1 kg (170 lb), SpO2 99 %.Body mass index is 24.39 kg/m.  General Appearance: Casual  Eye Contact:  Good  Speech:  Normal Rate  Volume:  Normal  Mood:  Angry and Irritable  Affect:  Congruent  Thought Process:  Coherent and Descriptions of Associations: Intact  Orientation:  Full (Time, Place, and Person)  Thought Content:  WDL and Logical  Suicidal Thoughts:  No  Homicidal Thoughts:  No  Memory:  Immediate;   Good Recent;   Good Remote;   Good  Judgement:  Fair  Insight:  Fair  Psychomotor Activity:  Normal  Concentration:  Concentration: Good and Attention Span: Good  Recall:   Good  Fund of Knowledge:  Fair  Language:  Good  Akathisia:  No  Handed:  Right  AIMS (if indicated):     Assets:  Leisure Time Physical Health Resilience Social Support  ADL's:  Intact  Cognition:  WNL  Sleep:      Mental Status Per Nursing Assessment::   On Admission:    cocaine abuse with suicidal ideations  Demographic Factors:  Male  Loss Factors: Legal issues  Historical Factors: NA  Risk Reduction Factors:   Sense of responsibility to family, Living with another person, especially a relative, Positive social support and Positive therapeutic relationship  Continued Clinical Symptoms:  Irritable and angry  Cognitive Features That Contribute To Risk:  None    Suicide Risk:  Minimal: No identifiable suicidal ideation.  Patients presenting with no risk factors but with morbid ruminations; may be classified as minimal risk based on the severity of the depressive symptoms    Plan Of Care/Follow-up recommendations:  Activity:  as tolerated Diet:  heart healthy diet  Aleesa Sweigert, NP 12/04/2017, 11:31 AM
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
## 2017-12-04 NOTE — ED Notes (Signed)
Security and [employer] required to discharge pt.

## 2017-12-04 NOTE — ED Notes (Signed)
Pt discharged. All belongings returned to pt.

## 2017-12-04 NOTE — Consult Note (Addendum)
[facility] Face-to-Face Psychiatry Consult   Reason for Consult:  Cocaine abuse with suicidal ideations Referring Physician:  EDP Patient Identification: Kristopher Miller MRN:  [id number] Principal Diagnosis: Cocaine abuse with cocaine-induced disorder [facility]) Diagnosis:   Patient Active Problem List   Diagnosis Date Noted  . Cocaine abuse with cocaine-induced disorder [facility]) [F14.19] 11/30/2017    Priority: High    Total Time spent with patient: 45 minutes  Subjective:   Kristopher Miller is a 37 y.o. male patient does not warrant admission.  HPI:  37 yo male who was using cocaine when he started having suicidal ideations.  He reports some depression related to family issues and legal charges.  On assessment, he denies suicidal/homicidal ideations, hallucinations, and withdrawal symptoms.  He would like to meet with peer support.  After leaving his room, he flipped his table and demanded paper work so he could present it to court since he missed his court date yesterday.  Stable for discharge, escorted out by police.  Past Psychiatric History: substance abuse  Risk to self: None Risk to Others: None Prior Inpatient Therapy: Prior Inpatient Therapy: Yes Prior Therapy Dates: 2011 Prior Therapy Facilty/Provider(s): multiple, [facility], [facility] Reason for Treatment: SCHIZOPHRENIA Prior Outpatient Therapy: Prior Outpatient Therapy: Yes Prior Therapy Dates: CURRENT Prior Therapy Facilty/Provider(s): [place] Reason for Treatment: MED MANAGEMENT Does patient have an ACCT team?: No Does patient have Intensive In-House Services?  : No Does patient have [facility] services? : Yes Does patient have P4CC services?: No  Past Medical History: History reviewed. No pertinent past medical history. History reviewed. No pertinent surgical history. Family History: History reviewed. No pertinent family history. Family Psychiatric  History: none Social History:  Social History   Substance and  Sexual Activity  Alcohol Use Yes   Comment: occ     Social History   Substance and Sexual Activity  Drug Use Yes  . Types: Marijuana    Social History   Socioeconomic History  . Marital status: Single    Spouse name: Not on file  . Number of children: Not on file  . Years of education: Not on file  . Highest education level: Not on file  Occupational History  . Not on file  Social Needs  . Financial resource strain: Not on file  . Food insecurity:    Worry: Not on file    Inability: Not on file  . Transportation needs:    Medical: Not on file    Non-medical: Not on file  Tobacco Use  . Smoking status: Current Every Day Smoker  . Smokeless tobacco: Never Used  Substance and Sexual Activity  . Alcohol use: Yes    Comment: occ  . Drug use: Yes    Types: Marijuana  . Sexual activity: Not on file  Lifestyle  . Physical activity:    Days per week: Not on file    Minutes per session: Not on file  . Stress: Not on file  Relationships  . Social connections:    Talks on phone: Not on file    Gets together: Not on file    Attends religious service: Not on file    Active member of club or organization: Not on file    Attends meetings of clubs or organizations: Not on file    Relationship status: Not on file  Other Topics Concern  . Not on file  Social History Narrative  . Not on file   Additional Social History: N/A  Allergies:  No Known Allergies  Labs:  Results for orders placed or performed during the hospital encounter of 12/03/17 (from the past 48 hour(s))  Rapid urine drug screen (hospital performed)     Status: Abnormal   Collection Time: 12/03/17  4:42 PM  Result Value Ref Range   Opiates NONE DETECTED NONE DETECTED   Cocaine POSITIVE (A) NONE DETECTED   Benzodiazepines NONE DETECTED NONE DETECTED   Amphetamines NONE DETECTED NONE DETECTED   Tetrahydrocannabinol POSITIVE (A) NONE DETECTED   Barbiturates (A) NONE DETECTED    Result not available.  Reagent lot number recalled by manufacturer.    Comment: Performed at [facility], [place] [address], [place], [place] [address]  Comprehensive metabolic panel     Status: Abnormal   Collection Time: 12/03/17  5:17 PM  Result Value Ref Range   Sodium 143 135 - 145 mmol/L   Potassium 3.6 3.5 - 5.1 mmol/L   Chloride 109 98 - 111 mmol/L    Comment: Please note change in reference range.   CO2 27 22 - 32 mmol/L   Glucose, Bld 130 (H) 70 - 99 mg/dL    Comment: Please note change in reference range.   BUN 16 6 - 20 mg/dL    Comment: Please note change in reference range.   Creatinine, Ser 1.18 0.61 - 1.24 mg/dL   Calcium 9.1 8.9 - 10.3 mg/dL   Total Protein 7.2 6.5 - 8.1 g/dL   Albumin 4.1 3.5 - 5.0 g/dL   AST 26 15 - 41 U/L   ALT 20 0 - 44 U/L    Comment: Please note change in reference range.   Alkaline Phosphatase 41 38 - 126 U/L   Total Bilirubin 0.8 0.3 - 1.2 mg/dL   GFR calc non Af Amer >60 >60 mL/min   GFR calc Af Amer >60 >60 mL/min    Comment: (NOTE) The eGFR has been calculated using the CKD EPI equation. This calculation has not been validated in all clinical situations. eGFR's persistently <60 mL/min signify possible Chronic Kidney Disease.    Anion gap 7 5 - 15    Comment: Performed at [facility], [place] [address] [place], [place] [address]  Ethanol     Status: None   Collection Time: 12/03/17  5:17 PM  Result Value Ref Range   Alcohol, Ethyl (B) <10 <10 mg/dL    Comment: (NOTE) Lowest detectable limit for serum alcohol is 10 mg/dL. For medical purposes only. Performed at [facility], [place] [address], [place], [place] [address]   Salicylate level     Status: None   Collection Time: 12/03/17  5:17 PM  Result Value Ref Range   Salicylate Lvl <0.3 2.8 - 30.0 mg/dL    Comment: Performed at [facility], [place] [address], [place], [place] [address]  Acetaminophen level     Status: Abnormal    Collection Time: 12/03/17  5:17 PM  Result Value Ref Range   Acetaminophen (Tylenol), Serum <10 (L) 10 - 30 ug/mL    Comment: (NOTE) Therapeutic concentrations vary significantly. A range of 10-30 ug/mL  may be an effective concentration for many patients. However, some  are best treated at concentrations outside of this range. Acetaminophen concentrations >150 ug/mL at 4 hours after ingestion  and >50 ug/mL at 12 hours after ingestion are often associated with  toxic reactions. Performed at [facility], [place] [address], [place], [place] [address]  cbc     Status: None   Collection Time: 12/03/17  5:17 PM  Result Value Ref Range   WBC 6.2 4.0 - 10.5 K/uL   RBC 4.40 4.22 - 5.81 MIL/uL   Hemoglobin 14.2 13.0 - 17.0 g/dL   HCT 42.2 39.0 - 52.0 %   MCV 95.9 78.0 - 100.0 fL   MCH 32.3 26.0 - 34.0 pg   MCHC 33.6 30.0 - 36.0 g/dL   RDW 12.1 11.5 - 15.5 %   Platelets 229 150 - 400 K/uL    Comment: Performed at [facility], [place] [address] [place], [place] [address]    Current Facility-Administered Medications  Medication Dose Route Frequency Provider Last Rate Last Dose  . acetaminophen (TYLENOL) tablet 650 mg  650 mg Oral Q4H PRN Maczis, Barth Kirks, PA-C      . cariprazine (VRAYLAR) capsule 1.5 mg  1.5 mg Oral Daily Jillyn Ledger, PA-C   1.5 mg at 12/03/17 2036  . hydrOXYzine (ATARAX/VISTARIL) tablet 10 mg  10 mg Oral TID PRN Jillyn Ledger, PA-C      . nicotine (NICODERM CQ - dosed in mg/24 hours) patch 21 mg  21 mg Transdermal Daily Jillyn Ledger, PA-C   21 mg at 12/03/17 1844  . ondansetron (ZOFRAN) tablet 4 mg  4 mg Oral Q8H PRN Jillyn Ledger, PA-C       Current Outpatient Medications  Medication Sig Dispense Refill  . cariprazine (VRAYLAR) capsule Take 1.5 mg by mouth at bedtime.    . hydrOXYzine (ATARAX/VISTARIL) 10 MG tablet Take 10 mg by mouth 3 (three) times daily as needed for anxiety.      Musculoskeletal: Strength &  Muscle Tone: within normal limits Gait & Station: normal Patient leans: N/A  Psychiatric Specialty Exam: Physical Exam  Nursing note and vitals reviewed. Constitutional: He is oriented to person, place, and time. He appears well-developed and well-nourished.  HENT:  Head: Normocephalic and atraumatic.  Neck: Normal range of motion.  Respiratory: Effort normal.  Musculoskeletal: Normal range of motion.  Neurological: He is alert and oriented to person, place, and time.  Psychiatric: His speech is normal. Judgment and thought content normal. His mood appears anxious. He is agitated. Cognition and memory are normal.    Review of Systems  Psychiatric/Behavioral: Positive for substance abuse. The patient is nervous/anxious.   All other systems reviewed and are negative.   Blood pressure (!) 134/98, pulse (!) 52, temperature 98 F (36.7 C), temperature source Oral, resp. rate 16, height '5\' 10"'  (1.778 m), weight 77.1 kg (170 lb), SpO2 99 %.Body mass index is 24.39 kg/m.  General Appearance: Casual  Eye Contact:  Good  Speech:  Normal Rate  Volume:  Normal  Mood:  Angry and Irritable  Affect:  Congruent  Thought Process:  Coherent and Descriptions of Associations: Intact  Orientation:  Full (Time, Place, and Person)  Thought Content:  WDL and Logical  Suicidal Thoughts:  No  Homicidal Thoughts:  No  Memory:  Immediate;   Good Recent;   Good Remote;   Good  Judgement:  Fair  Insight:  Fair  Psychomotor Activity:  Normal  Concentration:  Concentration: Good and Attention Span: Good  Recall:  Good  Fund of Knowledge:  Fair  Language:  Good  Akathisia:  No  Handed:  Right  AIMS (if indicated):   N/A  Assets:  Leisure Time Physical Health Resilience Social Support  ADL's:  Intact  Cognition:  WNL  Sleep:   N/A     Treatment Plan Summary: Cocaine abuse with cocaine induced mood disorder: -Continued Vraylar 1.5 mg daily for mood stabilization -Hydroxyzine 10 mg TID PRN  for anxiety  Disposition: No evidence of imminent risk to self or others at present.    Waylan Boga, NP 12/04/2017 10:04 AM   Patient seen face-to-face for psychiatric evaluation, chart reviewed and case discussed with the physician extender and developed treatment plan. Reviewed the information documented and agree with the treatment plan.  Buford Dresser, DO 12/04/17 5:54 PM

## 2017-12-28 ENCOUNTER — Other Ambulatory Visit: Payer: Self-pay

## 2017-12-28 ENCOUNTER — Encounter (HOSPITAL_COMMUNITY): Payer: Self-pay | Admitting: Emergency Medicine

## 2017-12-28 ENCOUNTER — Emergency Department (HOSPITAL_COMMUNITY): Payer: Medicaid Other

## 2017-12-28 ENCOUNTER — Emergency Department (HOSPITAL_COMMUNITY)
Admission: EM | Admit: 2017-12-28 | Discharge: 2017-12-28 | Disposition: A | Discharge status: Home / Self Care | Payer: Medicaid Other | Attending: Emergency Medicine | Admitting: Emergency Medicine

## 2017-12-28 DIAGNOSIS — Z7689 Persons encountering health services in other specified circumstances: Secondary | ICD-10-CM

## 2017-12-28 DIAGNOSIS — Y999 Unspecified external cause status: Secondary | ICD-10-CM | POA: Insufficient documentation

## 2017-12-28 DIAGNOSIS — S51811A Laceration without foreign body of right forearm, initial encounter: Secondary | ICD-10-CM | POA: Diagnosis not present

## 2017-12-28 DIAGNOSIS — S61411A Laceration without foreign body of right hand, initial encounter: Secondary | ICD-10-CM | POA: Insufficient documentation

## 2017-12-28 DIAGNOSIS — R440 Auditory hallucinations: Secondary | ICD-10-CM | POA: Diagnosis not present

## 2017-12-28 DIAGNOSIS — Z23 Encounter for immunization: Secondary | ICD-10-CM | POA: Insufficient documentation

## 2017-12-28 DIAGNOSIS — Y92810 Car as the place of occurrence of the external cause: Secondary | ICD-10-CM | POA: Diagnosis not present

## 2017-12-28 DIAGNOSIS — Z046 Encounter for general psychiatric examination, requested by authority: Secondary | ICD-10-CM | POA: Insufficient documentation

## 2017-12-28 DIAGNOSIS — Y9389 Activity, other specified: Secondary | ICD-10-CM | POA: Diagnosis not present

## 2017-12-28 DIAGNOSIS — F17210 Nicotine dependence, cigarettes, uncomplicated: Secondary | ICD-10-CM | POA: Diagnosis not present

## 2017-12-28 DIAGNOSIS — S61216A Laceration without foreign body of right little finger without damage to nail, initial encounter: Secondary | ICD-10-CM | POA: Diagnosis not present

## 2017-12-28 DIAGNOSIS — W25XXXA Contact with sharp glass, initial encounter: Secondary | ICD-10-CM | POA: Insufficient documentation

## 2017-12-28 DIAGNOSIS — S41111A Laceration without foreign body of right upper arm, initial encounter: Secondary | ICD-10-CM

## 2017-12-28 HISTORY — DX: Schizophrenia, unspecified: F20.9

## 2017-12-28 LAB — BASIC METABOLIC PANEL
ANION GAP: 10 (ref 5–15)
BUN: 15 mg/dL (ref 6–20)
CO2: 24 mmol/L (ref 22–32)
Calcium: 9.6 mg/dL (ref 8.9–10.3)
Chloride: 108 mmol/L (ref 98–111)
Creatinine, Ser: 1.02 mg/dL (ref 0.61–1.24)
GFR calc Af Amer: >60 mL/min (ref >60)
Glucose, Bld: 107 mg/dL — ABNORMAL HIGH (ref 70–99)
POTASSIUM: 3.9 mmol/L (ref 3.5–5.1)
SODIUM: 142 mmol/L (ref 135–145)

## 2017-12-28 LAB — CBC WITH DIFFERENTIAL/PLATELET
BASOS ABS: 0 10*3/uL (ref 0.0–0.1)
Basophils Relative: 0 %
EOS ABS: 0.1 10*3/uL (ref 0.0–0.7)
Eosinophils Relative: 1 %
HCT: 43.6 % (ref 39.0–52.0)
HEMOGLOBIN: 14.8 g/dL (ref 13.0–17.0)
LYMPHS ABS: 2.8 10*3/uL (ref 0.7–4.0)
LYMPHS PCT: 26 %
MCH: 32.5 pg (ref 26.0–34.0)
MCHC: 33.9 g/dL (ref 30.0–36.0)
MCV: 95.8 fL (ref 78.0–100.0)
Monocytes Absolute: 1.2 10*3/uL — ABNORMAL HIGH (ref 0.1–1.0)
Monocytes Relative: 12 %
NEUTROS PCT: 61 %
Neutro Abs: 6.4 10*3/uL (ref 1.7–7.7)
Platelets: 213 10*3/uL (ref 150–400)
RBC: 4.55 MIL/uL (ref 4.22–5.81)
RDW: 12.3 % (ref 11.5–15.5)
WBC: 10.5 10*3/uL (ref 4.0–10.5)

## 2017-12-28 LAB — SALICYLATE LEVEL

## 2017-12-28 LAB — ETHANOL

## 2017-12-28 LAB — ACETAMINOPHEN LEVEL

## 2017-12-28 MED ORDER — CEPHALEXIN 500 MG PO CAPS: 500.0000 mg | ORAL_CAPSULE | Freq: Four times a day (QID) | ORAL | 0 refills | Status: AC

## 2017-12-28 MED ORDER — LIDOCAINE HCL 2 % IJ SOLN
20.0000 mL | Freq: Once | INTRAMUSCULAR | Status: AC
  Administered 2017-12-28: 400 mg via INTRADERMAL
  Filled 2017-12-28: qty 20

## 2017-12-28 MED ORDER — LORAZEPAM 1 MG PO TABS
2.0000 mg | ORAL_TABLET | Freq: Once | ORAL | Status: AC
  Administered 2017-12-28: 2 mg via ORAL
  Filled 2017-12-28: qty 2

## 2017-12-28 MED ORDER — BACITRACIN ZINC 500 UNIT/GM EX OINT
TOPICAL_OINTMENT | CUTANEOUS | Status: AC
  Administered 2017-12-28: 1
  Filled 2017-12-28: qty 0.9

## 2017-12-28 MED ORDER — NICOTINE 21 MG/24HR TD PT24
21.0000 mg | MEDICATED_PATCH | Freq: Once | TRANSDERMAL | Status: DC
  Administered 2017-12-28: 21 mg via TRANSDERMAL
  Filled 2017-12-28: qty 1

## 2017-12-28 MED ORDER — ACETAMINOPHEN 325 MG PO TABS
650.0000 mg | ORAL_TABLET | Freq: Once | ORAL | Status: AC
  Administered 2017-12-28: 650 mg via ORAL
  Filled 2017-12-28: qty 2

## 2017-12-28 MED ORDER — TETANUS-DIPHTH-ACELL PERTUSSIS 5-2.5-18.5 LF-MCG/0.5 IM SUSP
0.5000 mL | Freq: Once | INTRAMUSCULAR | Status: AC
  Administered 2017-12-28: 0.5 mL via INTRAMUSCULAR
  Filled 2017-12-28: qty 0.5

## 2017-12-28 NOTE — ED Notes (Signed)
Refuses to give urine or to have additional VS taken.
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
## 2017-12-28 NOTE — ED Notes (Signed)
Bed: WTR7 Expected date:  Expected time:  Means of arrival:  Comments: 
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
## 2017-12-29 NOTE — BH Assessment (Signed)
[facility] Assessment Progress Note                               [facility] for [facility] evaluation/admission. Arville CareParks FNP reviewed case and determined patient did not meet criteria for a psychiatric admission.

## 2017-12-29 NOTE — BH Assessment (Addendum)
[facility] Assessment Progress Note  Per Kizzie BaneHughes: "Per Lorenso QuarryHiren Umrania, MD, this pt does not require psychiatric hospitalization at this time.  [facility] to continue treatment with [facility].  This has been included in pt's discharge instructions. Pt would also benefit from seeing [facility]; they will be asked to speak to pt.  Pt's nurse, Morrie Sheldonshley, has been notified."

## 2017-12-29 NOTE — BH Assessment (Addendum)
[facility] Assessment Progress Note Patient's case was staffed with Arville CareParks FNP on 12/28/17 who stated patient does not meet inpatient psychiatric criteria.

## 2018-01-09 ENCOUNTER — Ambulatory Visit (HOSPITAL_COMMUNITY)
Admission: EM | Admit: 2018-01-09 | Discharge: 2018-01-09 | Disposition: A | Discharge status: Home / Self Care | Payer: Medicaid Other | Attending: Family Medicine | Admitting: Family Medicine

## 2018-01-09 DIAGNOSIS — F17200 Nicotine dependence, unspecified, uncomplicated: Secondary | ICD-10-CM | POA: Diagnosis not present

## 2018-01-09 DIAGNOSIS — L0889 Other specified local infections of the skin and subcutaneous tissue: Secondary | ICD-10-CM

## 2018-01-09 DIAGNOSIS — F209 Schizophrenia, unspecified: Secondary | ICD-10-CM | POA: Diagnosis not present

## 2018-01-09 DIAGNOSIS — Y838 Other surgical procedures as the cause of abnormal reaction of the patient, or of later complication, without mention of misadventure at the time of the procedure: Secondary | ICD-10-CM | POA: Diagnosis not present

## 2018-01-09 DIAGNOSIS — S41111A Laceration without foreign body of right upper arm, initial encounter: Secondary | ICD-10-CM | POA: Diagnosis not present

## 2018-01-09 DIAGNOSIS — T8141XA Infection following a procedure, superficial incisional surgical site, initial encounter: Secondary | ICD-10-CM | POA: Insufficient documentation

## 2018-01-09 DIAGNOSIS — T8149XA Infection following a procedure, other surgical site, initial encounter: Secondary | ICD-10-CM

## 2018-01-09 MED ORDER — LIDOCAINE HCL 2 % IJ SOLN
INTRAMUSCULAR | Status: AC
Start: 2018-01-09 — End: ?
  Filled 2018-01-09: qty 20

## 2018-01-09 MED ORDER — LIDOCAINE-EPINEPHRINE (PF) 2 %-1:200000 IJ SOLN
INTRAMUSCULAR | Status: AC
  Filled 2018-01-09: qty 20

## 2018-01-09 MED ORDER — DOXYCYCLINE HYCLATE 100 MG PO CAPS: 100.0000 mg | ORAL_CAPSULE | Freq: Two times a day (BID) | ORAL | 0 refills | Status: DC

## 2018-01-09 NOTE — ED Notes (Signed)
Pt here for suture removal on wrist, after removing the bandage, area is red, swollen, appears infected. Providers notified. THey will examine the patient.

## 2018-01-10 ENCOUNTER — Telehealth (HOSPITAL_COMMUNITY): Payer: Self-pay
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
## 2018-01-13 LAB — AEROBIC CULTURE W GRAM STAIN (SUPERFICIAL SPECIMEN)
Culture: NORMAL
Special Requests: NORMAL

## 2018-09-22 ENCOUNTER — Emergency Department (HOSPITAL_COMMUNITY)
Admission: EM | Admit: 2018-09-22 | Discharge: 2018-09-23 | Disposition: A | Discharge status: Home / Self Care | Payer: Medicaid Other | Attending: Emergency Medicine | Admitting: Emergency Medicine

## 2018-09-22 ENCOUNTER — Encounter (HOSPITAL_COMMUNITY): Payer: Self-pay

## 2018-09-22 ENCOUNTER — Other Ambulatory Visit: Payer: Self-pay

## 2018-09-22 ENCOUNTER — Emergency Department (HOSPITAL_COMMUNITY): Payer: Medicaid Other

## 2018-09-22 ENCOUNTER — Ambulatory Visit (HOSPITAL_COMMUNITY)
Admission: AD | Admit: 2018-09-22 | Discharge: 2018-09-22 | Disposition: A | Discharge status: Home / Self Care | Payer: Medicaid Other | Attending: Psychiatry | Admitting: Psychiatry

## 2018-09-22 DIAGNOSIS — F17210 Nicotine dependence, cigarettes, uncomplicated: Secondary | ICD-10-CM | POA: Insufficient documentation

## 2018-09-22 DIAGNOSIS — Z20828 Contact with and (suspected) exposure to other viral communicable diseases: Secondary | ICD-10-CM | POA: Diagnosis not present

## 2018-09-22 DIAGNOSIS — F1910 Other psychoactive substance abuse, uncomplicated: Secondary | ICD-10-CM | POA: Diagnosis present

## 2018-09-22 DIAGNOSIS — R45851 Suicidal ideations: Secondary | ICD-10-CM | POA: Diagnosis not present

## 2018-09-22 DIAGNOSIS — Z79899 Other long term (current) drug therapy: Secondary | ICD-10-CM | POA: Diagnosis not present

## 2018-09-22 DIAGNOSIS — Z046 Encounter for general psychiatric examination, requested by authority: Secondary | ICD-10-CM | POA: Diagnosis present

## 2018-09-22 DIAGNOSIS — R05 Cough: Secondary | ICD-10-CM | POA: Insufficient documentation

## 2018-09-22 DIAGNOSIS — F209 Schizophrenia, unspecified: Secondary | ICD-10-CM | POA: Diagnosis not present

## 2018-09-22 DIAGNOSIS — F19188 Other psychoactive substance abuse with other psychoactive substance-induced disorder: Secondary | ICD-10-CM | POA: Diagnosis not present

## 2018-09-22 DIAGNOSIS — R059 Cough, unspecified: Secondary | ICD-10-CM

## 2018-09-22 LAB — COMPREHENSIVE METABOLIC PANEL
ALT: 24 U/L (ref 0–44)
AST: 36 U/L (ref 15–41)
Albumin: 4.9 g/dL (ref 3.5–5.0)
Alkaline Phosphatase: 54 U/L (ref 38–126)
Anion gap: 9 (ref 5–15)
BUN: 15 mg/dL (ref 6–20)
CO2: 23 mmol/L (ref 22–32)
Calcium: 9.3 mg/dL (ref 8.9–10.3)
Chloride: 108 mmol/L (ref 98–111)
Creatinine, Ser: 1.2 mg/dL (ref 0.61–1.24)
GFR calc Af Amer: >60 mL/min (ref >60)
GFR calc non Af Amer: >60 mL/min (ref >60)
Glucose, Bld: 111 mg/dL — ABNORMAL HIGH (ref 70–99)
Potassium: 3.8 mmol/L (ref 3.5–5.1)
Sodium: 140 mmol/L (ref 135–145)
Total Bilirubin: 1 mg/dL (ref 0.3–1.2)
Total Protein: 8.5 g/dL — ABNORMAL HIGH (ref 6.5–8.1)

## 2018-09-22 LAB — RAPID URINE DRUG SCREEN, HOSP PERFORMED
Amphetamines: NOT DETECTED
Barbiturates: NOT DETECTED
Benzodiazepines: NOT DETECTED
Cocaine: POSITIVE — AB
Opiates: NOT DETECTED
Tetrahydrocannabinol: POSITIVE — AB

## 2018-09-22 LAB — CBC WITH DIFFERENTIAL/PLATELET
Abs Immature Granulocytes: 0.04 10*3/uL (ref 0.00–0.07)
Basophils Absolute: 0 10*3/uL (ref 0.0–0.1)
Basophils Relative: 1 %
Eosinophils Absolute: 0 10*3/uL (ref 0.0–0.5)
Eosinophils Relative: 1 %
HCT: 46.5 % (ref 39.0–52.0)
Hemoglobin: 15.5 g/dL (ref 13.0–17.0)
Immature Granulocytes: 1 %
Lymphocytes Relative: 24 %
Lymphs Abs: 1.7 10*3/uL (ref 0.7–4.0)
MCH: 32.7 pg (ref 26.0–34.0)
MCHC: 33.3 g/dL (ref 30.0–36.0)
MCV: 98.1 fL (ref 80.0–100.0)
Monocytes Absolute: 1 10*3/uL (ref 0.1–1.0)
Monocytes Relative: 14 %
Neutro Abs: 4.4 10*3/uL (ref 1.7–7.7)
Neutrophils Relative %: 59 %
Platelets: 253 10*3/uL (ref 150–400)
RBC: 4.74 MIL/uL (ref 4.22–5.81)
RDW: 11.9 % (ref 11.5–15.5)
WBC: 7.2 10*3/uL (ref 4.0–10.5)
nRBC: 0 % (ref 0.0–0.2)

## 2018-09-22 LAB — ETHANOL: Alcohol, Ethyl (B): <10 mg/dL (ref <10)

## 2018-09-22 LAB — SARS CORONAVIRUS 2 BY RT PCR (HOSPITAL ORDER, PERFORMED IN ~~LOC~~ HOSPITAL LAB): SARS Coronavirus 2: NEGATIVE

## 2018-09-22 MED ORDER — HYDROXYZINE HCL 10 MG PO TABS
10.0000 mg | ORAL_TABLET | Freq: Three times a day (TID) | ORAL | Status: DC | PRN
  Administered 2018-09-22: 10 mg via ORAL
  Filled 2018-09-22: qty 1

## 2018-09-22 MED ORDER — ZIPRASIDONE MESYLATE 20 MG IM SOLR: 20.0000 mg | INTRAMUSCULAR | Status: DC | PRN

## 2018-09-22 MED ORDER — LORAZEPAM 1 MG PO TABS: 1.0000 mg | ORAL_TABLET | ORAL | Status: DC | PRN

## 2018-09-22 MED ORDER — NICOTINE 21 MG/24HR TD PT24
21.0000 mg | MEDICATED_PATCH | Freq: Once | TRANSDERMAL | Status: AC
  Administered 2018-09-22: 21 mg via TRANSDERMAL
  Filled 2018-09-22: qty 1

## 2018-09-22 MED ORDER — LORAZEPAM 1 MG PO TABS
1.0000 mg | ORAL_TABLET | Freq: Once | ORAL | Status: AC
  Administered 2018-09-22: 20:00:00 1 mg via ORAL
  Filled 2018-09-22: qty 1

## 2018-09-22 MED ORDER — CEFTRIAXONE SODIUM 250 MG IJ SOLR: 250.0000 mg | Freq: Once | INTRAMUSCULAR | Status: DC

## 2018-09-22 MED ORDER — CARIPRAZINE HCL 1.5 MG PO CAPS
1.5000 mg | ORAL_CAPSULE | Freq: Every day | ORAL | Status: DC
  Administered 2018-09-22 – 2018-09-23 (×2): 1.5 mg via ORAL
  Filled 2018-09-22 (×2): qty 1

## 2018-09-22 MED ORDER — OLANZAPINE 10 MG PO TBDP: 10.0000 mg | ORAL_TABLET | Freq: Three times a day (TID) | ORAL | Status: DC | PRN

## 2018-09-22 MED ORDER — AZITHROMYCIN 1 G PO PACK: 1.0000 g | PACK | Freq: Once | ORAL | Status: DC

## 2018-09-22 NOTE — Progress Notes (Signed)
It was reported to me that patient presented to [facility] and was reporting shortness of breath and recently having high possibility of contact with another person with COVID 19. Patient was sent to the ED to evaluated and assessed for COVID 19 and a consult to TTS can be placed after patient is medically cleared.
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
## 2018-09-22 NOTE — ED Triage Notes (Signed)
States off schizophrenia medication and need a refill no SI/HI voiced.

## 2018-09-22 NOTE — ED Provider Notes (Signed)
[facility]-EMERGENCY DEPT Provider Note   CSN: [id number] Arrival date & time: 09/22/18  1539    History   Chief Complaint Chief Complaint  Patient presents with  . Medical Clearance    HPI Kristopher Miller is a 38 y.o. male.     38 year old male with history of schizophrenia who is here with suicidal as well as homicidal ideations.  Patient states that he has been out of his medications for several days.  He denies any active suicide attempt at this time.  He is unsure if he is responding to internal stimuli.  Has had a mild cough without fever or chills.  Possible COVID exposure.  Was seen at behavioral health and sent here.     Past Medical History:  Diagnosis Date  . Schizophrenia [facility](HCC)     Patient Active Problem List   Diagnosis Date Noted  . Cocaine abuse with cocaine-induced disorder (HCC) 11/30/2017    History reviewed. No pertinent surgical history.      Home Medications    Prior to Admission medications   Medication Sig Start Date End Date Taking? Authorizing Provider  cariprazine (VRAYLAR) capsule Take 1.5 mg by mouth at bedtime.    [provider]  doxycycline (VIBRAMYCIN) 100 MG capsule Take 1 capsule (100 mg total) by mouth 2 (two) times daily. 01/09/18   Mardella LaymanHagler, Brian, MD  hydrOXYzine (ATARAX/VISTARIL) 10 MG tablet Take 10 mg by mouth 3 (three) times daily as needed for anxiety.    [provider]    Family History History reviewed. No pertinent family history.  Social History Social History   Tobacco Use  . Smoking status: Current Every Day Smoker  . Smokeless tobacco: Never Used  Substance Use Topics  . Alcohol use: Yes    Comment: occ  . Drug use: Yes    Types: Marijuana     Allergies   Patient has no known allergies.   Review of Systems Review of Systems  All other systems reviewed and are negative.    Physical Exam Updated Vital Signs BP (!) 148/111   Pulse 100   Temp 99.4 F (37.4  C)   Resp 16   Ht 1.854 m (6\' 1" )   Wt 77.1 kg   SpO2 98%   BMI 22.43 kg/m   Physical Exam Vitals signs and nursing note reviewed.  Constitutional:      General: He is not in acute distress.    Appearance: Normal appearance. He is well-developed. He is not toxic-appearing.  HENT:     Head: Normocephalic and atraumatic.  Eyes:     General: Lids are normal.     Conjunctiva/sclera: Conjunctivae normal.     Pupils: Pupils are equal, round, and reactive to light.  Neck:     Musculoskeletal: Normal range of motion and neck supple.     Thyroid: No thyroid mass.     Trachea: No tracheal deviation.  Cardiovascular:     Rate and Rhythm: Normal rate and regular rhythm.     Heart sounds: Normal heart sounds. No murmur. No gallop.   Pulmonary:     Effort: Pulmonary effort is normal. No respiratory distress.     Breath sounds: Normal breath sounds. No stridor. No decreased breath sounds, wheezing, rhonchi or rales.  Abdominal:     General: Bowel sounds are normal. There is no distension.     Palpations: Abdomen is soft.     Tenderness: There is no abdominal tenderness. There is no  rebound.  Musculoskeletal: Normal range of motion.        General: No tenderness.  Skin:    General: Skin is warm and dry.     Findings: No abrasion or rash.  Neurological:     Mental Status: He is alert and oriented to person, place, and time.     GCS: GCS eye subscore is 4. GCS verbal subscore is 5. GCS motor subscore is 6.     Cranial Nerves: No cranial nerve deficit.     Sensory: No sensory deficit.  Psychiatric:        Attention and Perception: He is inattentive.        Mood and Affect: Affect is flat.        Speech: Speech is delayed.        Behavior: Behavior is slowed and withdrawn.        Thought Content: Thought content includes suicidal ideation.      ED Treatments / Results  Labs (all labs ordered are listed, but only abnormal results are displayed) Labs Reviewed  RESPIRATORY PANEL BY  PCR  CBC WITH DIFFERENTIAL/PLATELET  COMPREHENSIVE METABOLIC PANEL  ETHANOL  RAPID URINE DRUG SCREEN, HOSP PERFORMED    EKG None  Radiology No results found.  Procedures Procedures (including critical care time)  Medications Ordered in ED Medications - No data to display   Initial Impression / Assessment and Plan / ED Course  I have reviewed the triage vital signs and the nursing notes.  Pertinent labs & imaging results that were available during my care of the patient were reviewed by me and considered in my medical decision making (see chart for details).        Patient will be medically cleared for psychiatric disposition  Final Clinical Impressions(s) / ED Diagnoses   Final diagnoses:  None    ED Discharge Orders    None       Lorre Nick, MD 09/22/18 [phone]

## 2018-09-22 NOTE — ED Notes (Signed)
Security at bedside upon arrival to treatment room.

## 2018-09-22 NOTE — ED Notes (Signed)
Pt. Aware of urine. Pt unable to urine at this time. Will collect urine when pt. Voids. Nurse aware.

## 2018-09-22 NOTE — ED Notes (Signed)
Patient transported to X-ray 

## 2018-09-22 NOTE — ED Notes (Signed)
Bed: PP50 Expected date:  Expected time:  Means of arrival:  Comments: Room 23

## 2018-09-22 NOTE — BHH Counselor (Signed)
Pt reported, family, friend support however when clinician asked the pt to identify a support pt did not answer.    Redmond Pulling, MS, [facility], [facility] Triage Specialist [phone]

## 2018-09-22 NOTE — ED Notes (Signed)
Called TTS to see if patient can be consulted ASAP so patient can be cleared to be medicated. Kristopher Miller at TTS stated that there are 3 patients ahead of him.  Told Dr. Freida Busman about the wait,  gave an order for Ativan PO.  [employer] is outside of the room to ensure patient and staff safety till TTS consult complete.

## 2018-09-22 NOTE — ED Notes (Addendum)
Patient continues to ambulate outside of room. Patient is redirectable back into room. Security at bedside.

## 2018-09-23 ENCOUNTER — Other Ambulatory Visit: Payer: Self-pay

## 2018-09-23 DIAGNOSIS — F19188 Other psychoactive substance abuse with other psychoactive substance-induced disorder: Secondary | ICD-10-CM

## 2018-09-23 DIAGNOSIS — R45851 Suicidal ideations: Secondary | ICD-10-CM

## 2018-09-23 LAB — RESPIRATORY PANEL BY PCR

## 2018-09-23 MED ORDER — ACETAMINOPHEN 325 MG PO TABS
650.0000 mg | ORAL_TABLET | Freq: Four times a day (QID) | ORAL | Status: DC | PRN
  Administered 2018-09-23: 650 mg via ORAL
  Filled 2018-09-23: qty 2

## 2018-09-23 MED ORDER — CARIPRAZINE HCL 1.5 MG PO CAPS: 1.5000 mg | ORAL_CAPSULE | Freq: Every day | ORAL | 0 refills | Status: DC

## 2018-09-23 MED ORDER — HYDROXYZINE HCL 10 MG PO TABS: 10.0000 mg | ORAL_TABLET | Freq: Three times a day (TID) | ORAL | 0 refills | Status: DC | PRN
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
## 2018-09-23 NOTE — BH Assessment (Addendum)
[facility] Assessment Progress Note  Per Juanetta Beets, DO, this pt does not require psychiatric hospitalization at this time.  Pt presents under IVC initiated by EDP Lorre Nick, MD, which Dr Sharma Covert agrees to rescind.  Pt is to be discharged from [facility] with referral information for [facility], along with information regarding area supportive services for the homeless.  These have been included in pt's discharge instructions.  Pt's nurse has been notified.  Doylene Canning, MA Triage Specialist [phone]   Addendum:  IVC has been rescinded.  Pt's nurse has been notified.  Doylene Canning, [place] Behavioral Health Coordinator [phone]

## 2018-09-23 NOTE — TOC Initial Note (Addendum)
Transition of Care [facility]) - Initial/Assessment Note    Patient Details  Name: Kristopher Miller MRN: [id number] Date of Birth: [birth year]  Transition [facility]) CM/SW Contact:    Elliot Cousin, RN Phone Number: 09/23/2018, 3:00 PM  Clinical Narrative:                  Spoke to pt and provided information on his PCP with [facility] [address]. They will mail him a new patient package and arrange appt for him in June. They currently do not have any new patient appts. States he does receive disability $700 per month but states his girlfriend has his card or it is lost (unsure). He plans to call Social Security and Disability office to report lost or stolen. Provided pt with contact number to call.    Expected Discharge Plan: Home/Self Care Barriers to Discharge: No Barriers Identified   Patient Goals and CMS Choice        Expected Discharge Plan and Services Expected Discharge Plan: Home/Self Care   Discharge Planning Services: CM Consult     Expected Discharge Date: 09/23/18                                    Prior Living Arrangements/Services   Lives with:: Friends Patient language and need for interpreter reviewed:: Yes Do you feel safe going back to the place where you live?: Yes      Need for Family Participation in Patient Care: No (Comment)     Criminal Activity/Legal Involvement Pertinent to Current Situation/Hospitalization: No - Comment as needed  Activities of Daily Living Home Assistive Devices/Equipment: None ADL Screening (condition at time of admission) Patient's cognitive ability adequate to safely complete daily activities?: Yes Is the patient deaf or have difficulty hearing?: No Does the patient have difficulty seeing, even when wearing glasses/contacts?: No Does the patient have difficulty concentrating, remembering, or making decisions?: Yes Patient able to express need for assistance with ADLs?: Yes Does the patient have  difficulty dressing or bathing?: No Independently performs ADLs?: Yes (appropriate for developmental age) Does the patient have difficulty walking or climbing stairs?: No Weakness of Legs: None Weakness of Arms/Hands: None  Permission Sought/Granted Permission sought to share information with : Case Manager Permission granted to share information with : Yes, Verbal Permission Granted  Share Information with NAME: Gery Pray     Permission granted to share info w Relationship: brother  Permission granted to share info w Contact Information: [phone]  Emotional Assessment Appearance:: Appears stated age Attitude/Demeanor/Rapport: Engaged Affect (typically observed): Accepting Orientation: : Oriented to Self, Oriented to Place, Oriented to  Time, Oriented to Situation      Admission diagnosis:  Medication Refill Patient Active Problem List   Diagnosis Date Noted  . Cocaine abuse with cocaine-induced disorder (HCC) 11/30/2017   PCP:  Patient, No Pcp Per Pharmacy:   [facility] [address] [place] - [address] [place] [address] Phone: [phone] Fax: [phone]  [id number] [place], [place] - [address] AT [facility] [address] [place] [address] Phone: [phone] Fax: [phone]     Social Determinants of Health (SDOH) Interventions    Readmission Risk Interventions No flowsheet data found.

## 2018-09-23 NOTE — Discharge Instructions (Signed)
For your mental health needs, you are advised to follow up with [facility].  Call them at your earliest opportunity to schedule an intake appointment:       [facility]      [address]      [place], [place] [address]      [phone]      Crisis number: [phone]  For your shelter needs, contact the following service providers:       [facility])      [address] [place], [place] [address]      [phone]       [facility]      [address]      [place], [place] [address]      [phone]      This facility currently has one bed available.  You must go directly to the facility if you decide to take advantage of their services.  For day shelter and other supportive services for the homeless, contact the [facility]):       [facility]      [address]      [place], [place] [address]      [phone]  For transitional housing, Pension scheme manager.  They provide longer term housing than a shelter, but there is an application process:       [facility] of [facility]      [address], [place] [address]      [phone]

## 2018-09-24 DIAGNOSIS — F1910 Other psychoactive substance abuse, uncomplicated: Secondary | ICD-10-CM | POA: Diagnosis present
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
## 2022-01-15 ENCOUNTER — Encounter (HOSPITAL_COMMUNITY): Payer: Self-pay | Admitting: Emergency Medicine

## 2022-01-15 ENCOUNTER — Other Ambulatory Visit: Payer: Self-pay

## 2022-01-15 ENCOUNTER — Emergency Department (HOSPITAL_COMMUNITY)
Admission: EM | Admit: 2022-01-15 | Discharge: 2022-01-16 | Disposition: A | Discharge status: Home / Self Care | Payer: Medicaid Other | Attending: Emergency Medicine | Admitting: Emergency Medicine

## 2022-01-15 DIAGNOSIS — F4310 Post-traumatic stress disorder, unspecified: Secondary | ICD-10-CM | POA: Diagnosis not present

## 2022-01-15 DIAGNOSIS — F1910 Other psychoactive substance abuse, uncomplicated: Secondary | ICD-10-CM | POA: Diagnosis present

## 2022-01-15 DIAGNOSIS — F14959 Cocaine use, unspecified with cocaine-induced psychotic disorder, unspecified: Secondary | ICD-10-CM | POA: Diagnosis not present

## 2022-01-15 DIAGNOSIS — F14188 Cocaine abuse with other cocaine-induced disorder: Secondary | ICD-10-CM | POA: Diagnosis not present

## 2022-01-15 DIAGNOSIS — F209 Schizophrenia, unspecified: Secondary | ICD-10-CM | POA: Diagnosis not present

## 2022-01-15 DIAGNOSIS — F17200 Nicotine dependence, unspecified, uncomplicated: Secondary | ICD-10-CM | POA: Insufficient documentation

## 2022-01-15 DIAGNOSIS — Z20822 Contact with and (suspected) exposure to covid-19: Secondary | ICD-10-CM | POA: Diagnosis not present

## 2022-01-15 DIAGNOSIS — Z046 Encounter for general psychiatric examination, requested by authority: Secondary | ICD-10-CM | POA: Diagnosis present

## 2022-01-15 DIAGNOSIS — F23 Brief psychotic disorder: Secondary | ICD-10-CM

## 2022-01-15 LAB — CBC WITH DIFFERENTIAL/PLATELET
Abs Immature Granulocytes: 0.02 10*3/uL (ref 0.00–0.07)
Basophils Absolute: 0 10*3/uL (ref 0.0–0.1)
Basophils Relative: 0 %
Eosinophils Absolute: 0 10*3/uL (ref 0.0–0.5)
Eosinophils Relative: 0 %
HCT: 44.2 % (ref 39.0–52.0)
Hemoglobin: 14.5 g/dL (ref 13.0–17.0)
Immature Granulocytes: 0 %
Lymphocytes Relative: 17 %
Lymphs Abs: 1.2 10*3/uL (ref 0.7–4.0)
MCH: 31.6 pg (ref 26.0–34.0)
MCHC: 32.8 g/dL (ref 30.0–36.0)
MCV: 96.3 fL (ref 80.0–100.0)
Monocytes Absolute: 0.7 10*3/uL (ref 0.1–1.0)
Monocytes Relative: 9 %
Neutro Abs: 5.4 10*3/uL (ref 1.7–7.7)
Neutrophils Relative %: 74 %
Platelets: 227 10*3/uL (ref 150–400)
RBC: 4.59 MIL/uL (ref 4.22–5.81)
RDW: 11.9 % (ref 11.5–15.5)
WBC: 7.4 10*3/uL (ref 4.0–10.5)
nRBC: 0 % (ref 0.0–0.2)

## 2022-01-15 LAB — RESP PANEL BY RT-PCR (FLU A&B, COVID) ARPGX2
Influenza A by PCR: NEGATIVE
Influenza B by PCR: NEGATIVE
SARS Coronavirus 2 by RT PCR: NEGATIVE

## 2022-01-15 LAB — RAPID URINE DRUG SCREEN, HOSP PERFORMED
Amphetamines: NOT DETECTED
Barbiturates: NOT DETECTED
Benzodiazepines: NOT DETECTED
Cocaine: POSITIVE — AB
Opiates: NOT DETECTED
Tetrahydrocannabinol: POSITIVE — AB

## 2022-01-15 LAB — COMPREHENSIVE METABOLIC PANEL
ALT: 15 U/L (ref 0–44)
AST: 25 U/L (ref 15–41)
Albumin: 3.9 g/dL (ref 3.5–5.0)
Alkaline Phosphatase: 50 U/L (ref 38–126)
Anion gap: 9 (ref 5–15)
BUN: 12 mg/dL (ref 6–20)
CO2: 23 mmol/L (ref 22–32)
Calcium: 9 mg/dL (ref 8.9–10.3)
Chloride: 107 mmol/L (ref 98–111)
Creatinine, Ser: 1.16 mg/dL (ref 0.61–1.24)
GFR, Estimated: >60 mL/min (ref >60)
Glucose, Bld: 187 mg/dL — ABNORMAL HIGH (ref 70–99)
Potassium: 3.8 mmol/L (ref 3.5–5.1)
Sodium: 139 mmol/L (ref 135–145)
Total Bilirubin: 0.6 mg/dL (ref 0.3–1.2)
Total Protein: 6.7 g/dL (ref 6.5–8.1)

## 2022-01-15 LAB — ETHANOL: Alcohol, Ethyl (B): <10 mg/dL (ref <10)

## 2022-01-15 LAB — SALICYLATE LEVEL: Salicylate Lvl: <7 mg/dL — ABNORMAL LOW (ref 7.0–30.0)

## 2022-01-15 LAB — ACETAMINOPHEN LEVEL: Acetaminophen (Tylenol), Serum: <10 ug/mL — ABNORMAL LOW (ref 10–30)

## 2022-01-15 NOTE — ED Notes (Signed)
Staffing contacted for sitter, none available at this time. Pt resting, cooperative, [employer] at bedside

## 2022-01-15 NOTE — ED Notes (Signed)
TTS in process 

## 2022-01-15 NOTE — ED Provider Triage Note (Addendum)
Emergency Medicine Provider Triage Evaluation Note  Kristopher Miller , a 41 y.o. male  was evaluated in triage.  Pt complains of psychosis.  Patient presents with [employer] and EMS after wandering in his neighborhood.  Per [employer] the family is currently at the magistrate taking out IVC paperwork on the patient..  Report that the patient is schizophrenic and not taking his medications.  He was aggressive on scene and and then apparently catatonic and nonverbal.  Patient denies SI or HI but does endorse AVH.  He does not elaborate on the AVH.  Currently calm and cooperative in triage. He denies pain or complaints  Review of Systems  Positive: See above Negative:   Physical Exam  Ht 5\' 10"  (1.778 m)   SpO2 94%   BMI 24.39 kg/m  Gen:   Awake, no distress   Resp:  Normal effort  MSK:   Moves extremities without difficulty  Other:    Medical Decision Making  Medically screening exam initiated at 5:11 PM.  Appropriate orders placed.  Kristopher Miller was informed that the remainder of the evaluation will be completed by another provider, this initial triage assessment does not replace that evaluation, and the importance of remaining in the ED until their evaluation is complete.  First Exam papers have been completed and given to secretary for scan into chart. [employer] has IVC paperwork and will be giving it to .    Diplomatic Services operational officer, PA-C 01/15/22 1712    01/17/22, PA-C 01/15/22 1812

## 2022-01-15 NOTE — ED Notes (Signed)
IVC paperwork complete and in orange zone 

## 2022-01-15 NOTE — ED Triage Notes (Addendum)
Per ems, pt found in his neighborhood after doing cocaine and fentanyl. Hx of schizophrenia. Has not been taking his meds. Pt was intermittently aggressive on scene, then catatonic and nonverbal. Pt is A&Ox4 when verbal. Denies SI/HI. Reports hearing voices that are talking "gibberish." Denies etoh/drug use. Pt's family taking out IVC papers, PD at bedside. EMS HR 124, NSR, EKG unremarkable.

## 2022-01-15 NOTE — ED Provider Notes (Signed)
Patient is medically cleared for TTS evaluation    Lenard Galloway 01/15/22 1939    Tegeler, Canary Brim, MD 01/15/22 2004

## 2022-01-15 NOTE — ED Notes (Signed)
ED Provider at bedside. 

## 2022-01-15 NOTE — ED Notes (Signed)
Review of IVC Affidavit and Order indicated wrong name "Markelle Damon" on IVC filings; spoke to officer and informed him of the error and the need to re-IVC in proper legal name. Informed Dr. Rush Landmark, PA Cherlynn Polo and RN Charge Asher Muir.

## 2022-01-15 NOTE — ED Provider Notes (Signed)
[facility] EMERGENCY DEPARTMENT Provider Note   CSN: [id number] Arrival date & time: 01/15/22  1701     History  Chief Complaint  Patient presents with  . Drug / Alcohol Assessment  . Psychiatric Evaluation    Kristopher Miller is a 41 y.o. male.  Past medical history of polysubstance abuse including cocaine, schizophrenia who presents to the ED for medical clearance.  Per EMS patient has not been taking his medications, was found in his neighborhood after doing cocaine and fentanyl.  Reports he was talking gibberish.  Denies homicidal ideations.  Medical clearance has been initiated prior to my evaluation and has been resulted.  Patient has been seen by TTS, they state that [facility] is at capacity, but they will work on placement for him.  Upon my evaluation, patient is unwilling to provide his history, stating that he has already answered all of his questions.  Has no complaints at this time.  He does have suicidal ideations most days, has never acted on these ideations.  He is not aggressive at this time.   Drug / Alcohol Assessment Associated symptoms: confusion and suicidal ideation   Associated symptoms: no agitation        Home Medications Prior to Admission medications   Medication Sig Start Date End Date Taking? Authorizing Provider  cariprazine (VRAYLAR) capsule Take 1 capsule (1.5 mg total) by mouth daily. 09/24/18   Shaune Pollack, MD  hydrOXYzine (ATARAX/VISTARIL) 10 MG tablet Take 1 tablet (10 mg total) by mouth 3 (three) times daily as needed for anxiety. 09/23/18   Shaune Pollack, MD      Allergies    Patient has no known allergies.    Review of Systems   Review of Systems  Psychiatric/Behavioral:  Positive for confusion and suicidal ideas. Negative for agitation.   All other systems reviewed and are negative.   Physical Exam Updated Vital Signs BP 98/68 (BP Location: Left Arm)   Pulse 98   Temp 98.9 F (37.2 C) (Oral)   Resp 18   Ht 5\' 10"   (1.778 m)   SpO2 94%   BMI 24.39 kg/m  Physical Exam Vitals and nursing note reviewed.  Constitutional:      General: He is not in acute distress.    Appearance: Normal appearance. He is normal weight. He is not ill-appearing.  HENT:     Head: Normocephalic and atraumatic.  Pulmonary:     Effort: Pulmonary effort is normal. No respiratory distress.  Abdominal:     General: Abdomen is flat.  Musculoskeletal:        General: Normal range of motion.     Cervical back: Neck supple.  Skin:    General: Skin is warm and dry.  Neurological:     Mental Status: He is alert and oriented to person, place, and time.  Psychiatric:     Comments: Unwilling to contribute to his history.     ED Results / Procedures / Treatments   Labs (all labs ordered are listed, but only abnormal results are displayed) Labs Reviewed  COMPREHENSIVE METABOLIC PANEL - Abnormal; Notable for the following components:      Result Value   Glucose, Bld 187 (*)    All other components within normal limits  RAPID URINE DRUG SCREEN, HOSP PERFORMED - Abnormal; Notable for the following components:   Cocaine POSITIVE (*)    Tetrahydrocannabinol POSITIVE (*)    All other components within normal limits  ACETAMINOPHEN LEVEL - Abnormal; Notable  for the following components:   Acetaminophen (Tylenol), Serum <10 (*)    All other components within normal limits  SALICYLATE LEVEL - Abnormal; Notable for the following components:   Salicylate Lvl <7.0 (*)    All other components within normal limits  RESP PANEL BY RT-PCR (FLU A&B, COVID) [id number]  ETHANOL  CBC WITH DIFFERENTIAL/PLATELET    EKG EKG Interpretation  Date/Time:  Sunday January 15 2022 17:18:42 EDT Ventricular Rate:  94 PR Interval:  182 QRS Duration: 68 QT Interval:  342 QTC Calculation: 427 R Axis:   -77 Text Interpretation: Normal sinus rhythm Left axis deviation Cannot rule out Anterior infarct , age undetermined Abnormal ECG When compared with  ECG of 29-Nov-2017 17:08, PREVIOUS ECG IS PRESENT Confirmed by Eber Hong ([id number]) on 01/15/2022 9:04:56 PM  Radiology No results found.  Procedures Procedures    Medications Ordered in ED Medications - No data to display  ED Course/ Medical Decision Making/ A&P Clinical Course as of 01/15/22 2310  Sun Jan 15, 2022  2306 ED EKG EKG shows NSR.  No STEMI [AS]  2309 Urine rapid drug screen (hosp performed)(!) Positive for cocaine and THC [AS]    Clinical Course User Index [AS] Hughey Rittenberry, Edsel Petrin, PA-C   This patient presents to the ED for concern of psychosis, this involves an extensive number of treatment options, and is a complaint that carries with it a high risk of complications and morbidity.  The differential diagnosis includes psychosis, schizophrenia, suicidal ideation   Co morbidities that complicate the patient evaluation  Schizophrenia  My initial workup includes psychiatric medical clearance work-up  Additional history obtained from: Nursing notes from this visit. Prior ED visit on 10/24/2019 for psychosis EMS who provides the majority of the history due to patient being unwilling to cooperate  I ordered, reviewed and interpreted labs which include: UDS, salicylate, acetaminophen, ethanol, CBC, CMP, respiratory panel.  UDS positive for cocaine and THC.  Labs otherwise unremarkable.  Hemodynamically stable.  UDS positive for cocaine and THC.  Family members feel the IVC paperwork for patient.  Patient has been evaluated by TTS.  [facility] at capacity, TTS will attempt placement at other facilities.  Patient is unwilling to contribute to his history, but otherwise pleasant and not aggressive.  Stable at this time.  Patient's case discussed with Dr. Hyacinth Meeker who agrees with plan to admit to [facility] under IVC for psychosis.  Note: Portions of this report may have been transcribed using voice recognition software. Every effort was made to ensure accuracy; however, inadvertent  computerized transcription errors may still be present.                         Medical Decision Making           Final Clinical Impression(s) / ED Diagnoses Final diagnoses:  None    Rx / DC Orders ED Discharge Orders     None         Mora Bellman 01/15/22 2311    Eber Hong, MD 01/17/22 [phone]
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
## 2022-01-16 DIAGNOSIS — F4310 Post-traumatic stress disorder, unspecified: Secondary | ICD-10-CM

## 2022-01-16 DIAGNOSIS — F14959 Cocaine use, unspecified with cocaine-induced psychotic disorder, unspecified: Secondary | ICD-10-CM

## 2022-01-16 DIAGNOSIS — F209 Schizophrenia, unspecified: Secondary | ICD-10-CM

## 2022-01-16 MED ORDER — SERTRALINE HCL 25 MG PO TABS
25.0000 mg | ORAL_TABLET | Freq: Every day | ORAL | 0 refills | Status: DC
Start: 2022-01-16 — End: 2023-01-11

## 2022-01-16 MED ORDER — ARIPIPRAZOLE 10 MG PO TABS: 10.0000 mg | ORAL_TABLET | Freq: Every day | ORAL | 0 refills | Status: DC

## 2022-01-16 MED ORDER — SERTRALINE HCL 50 MG PO TABS
25.0000 mg | ORAL_TABLET | Freq: Every day | ORAL | Status: DC
  Administered 2022-01-16: 25 mg via ORAL
  Filled 2022-01-16: qty 1

## 2022-01-16 MED ORDER — ARIPIPRAZOLE 10 MG PO TABS
10.0000 mg | ORAL_TABLET | Freq: Every day | ORAL | Status: DC
  Administered 2022-01-16: 10 mg via ORAL
  Filled 2022-01-16: qty 1
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
## 2022-01-16 NOTE — ED Provider Notes (Signed)
Emergency Medicine Observation Re-evaluation Note  Kristopher Miller is a 41 y.o. male, seen on rounds today.  Pt initially presented to the ED for complaints of Drug / Alcohol Assessment and Psychiatric Evaluation Currently, the patient is resting comfortably without complaint.  Physical Exam  BP 98/68 (BP Location: Left Arm)   Pulse 98   Temp 98.9 F (37.2 C) (Oral)   Resp 18   Ht 5\' 10"  (1.778 m)   SpO2 94%   BMI 24.39 kg/m  Physical Exam Vitals and nursing note reviewed.  Constitutional:      General: He is not in acute distress. HENT:     Head: Normocephalic.  Cardiovascular:     Rate and Rhythm: Normal rate.  Pulmonary:     Effort: Pulmonary effort is normal.     ED Course / MDM  EKG:EKG Interpretation  Date/Time:  Sunday January 15 2022 17:18:42 EDT Ventricular Rate:  94 PR Interval:  182 QRS Duration: 68 QT Interval:  342 QTC Calculation: 427 R Axis:   -77 Text Interpretation: Normal sinus rhythm Left axis deviation Cannot rule out Anterior infarct , age undetermined Abnormal ECG When compared with ECG of 29-Nov-2017 17:08, PREVIOUS ECG IS PRESENT Confirmed by 01-Dec-2017 (Eber Hong) on 01/15/2022 9:04:56 PM  I have reviewed the labs performed to date as well as medications administered while in observation.  Recent changes in the last 24 hours include now IVC'ed awaiting placement.  Plan  Current plan is for placement. Kristopher Miller is under involuntary commitment.      Malyiah Fellows, 01/17/2022, MD 01/16/22 [phone]

## 2022-01-16 NOTE — ED Notes (Signed)
Patient stated he had someone that was coming to get him

## 2022-01-16 NOTE — Discharge Instructions (Signed)

## 2022-03-01 ENCOUNTER — Encounter (HOSPITAL_COMMUNITY): Payer: Medicaid Other | Admitting: Psychiatry

## 2022-03-01 ENCOUNTER — Encounter (HOSPITAL_COMMUNITY): Payer: Self-pay

## 2022-03-01 NOTE — Progress Notes (Signed)
Patient did not log in for scheduled initial virtual psychiatry appointment. Direct video link was sent to phone. Called patient and left voicemail with information to reschedule appointment.  Alda Berthold, MD 03/01/22

## 2022-06-10 ENCOUNTER — Ambulatory Visit (HOSPITAL_COMMUNITY)
Admission: EM | Admit: 2022-06-10 | Discharge: 2022-06-10 | Disposition: A | Discharge status: Home / Self Care | Payer: Medicaid Other | Attending: Family | Admitting: Family

## 2022-06-10 DIAGNOSIS — F209 Schizophrenia, unspecified: Secondary | ICD-10-CM

## 2022-06-10 DIAGNOSIS — F22 Delusional disorders: Secondary | ICD-10-CM

## 2022-06-10 DIAGNOSIS — Z1152 Encounter for screening for COVID-19: Secondary | ICD-10-CM | POA: Diagnosis not present

## 2022-06-10 DIAGNOSIS — R4689 Other symptoms and signs involving appearance and behavior: Secondary | ICD-10-CM

## 2022-06-10 DIAGNOSIS — F69 Unspecified disorder of adult personality and behavior: Secondary | ICD-10-CM | POA: Insufficient documentation

## 2022-06-10 DIAGNOSIS — F1290 Cannabis use, unspecified, uncomplicated: Secondary | ICD-10-CM | POA: Diagnosis not present

## 2022-06-10 DIAGNOSIS — Z79899 Other long term (current) drug therapy: Secondary | ICD-10-CM | POA: Insufficient documentation

## 2022-06-10 LAB — COMPREHENSIVE METABOLIC PANEL
ALT: 15 U/L (ref 0–44)
AST: 18 U/L (ref 15–41)
Albumin: 4.2 g/dL (ref 3.5–5.0)
Alkaline Phosphatase: 41 U/L (ref 38–126)
Anion gap: 11 (ref 5–15)
BUN: 13 mg/dL (ref 6–20)
CO2: 27 mmol/L (ref 22–32)
Calcium: 9.4 mg/dL (ref 8.9–10.3)
Chloride: 103 mmol/L (ref 98–111)
Creatinine, Ser: 1.13 mg/dL (ref 0.61–1.24)
GFR, Estimated: >60 mL/min (ref >60)
Glucose, Bld: 128 mg/dL — ABNORMAL HIGH (ref 70–99)
Potassium: 4 mmol/L (ref 3.5–5.1)
Sodium: 141 mmol/L (ref 135–145)
Total Bilirubin: 0.8 mg/dL (ref 0.3–1.2)
Total Protein: 7 g/dL (ref 6.5–8.1)

## 2022-06-10 LAB — CBC WITH DIFFERENTIAL/PLATELET
Abs Immature Granulocytes: 0.02 10*3/uL (ref 0.00–0.07)
Basophils Absolute: 0 10*3/uL (ref 0.0–0.1)
Basophils Relative: 0 %
Eosinophils Absolute: 0 10*3/uL (ref 0.0–0.5)
Eosinophils Relative: 1 %
HCT: 44.7 % (ref 39.0–52.0)
Hemoglobin: 15.2 g/dL (ref 13.0–17.0)
Immature Granulocytes: 0 %
Lymphocytes Relative: 21 %
Lymphs Abs: 1.5 10*3/uL (ref 0.7–4.0)
MCH: 32.5 pg (ref 26.0–34.0)
MCHC: 34 g/dL (ref 30.0–36.0)
MCV: 95.5 fL (ref 80.0–100.0)
Monocytes Absolute: 0.7 10*3/uL (ref 0.1–1.0)
Monocytes Relative: 9 %
Neutro Abs: 5 10*3/uL (ref 1.7–7.7)
Neutrophils Relative %: 69 %
Platelets: 213 10*3/uL (ref 150–400)
RBC: 4.68 MIL/uL (ref 4.22–5.81)
RDW: 11.9 % (ref 11.5–15.5)
WBC: 7.3 10*3/uL (ref 4.0–10.5)
nRBC: 0 % (ref 0.0–0.2)

## 2022-06-10 LAB — LIPID PANEL
Cholesterol: 234 mg/dL — ABNORMAL HIGH (ref 0–200)
HDL: 60 mg/dL (ref >40)
LDL Cholesterol: 162 mg/dL — ABNORMAL HIGH (ref 0–99)
Total CHOL/HDL Ratio: 3.9 RATIO
Triglycerides: 61 mg/dL (ref <150)
VLDL: 12 mg/dL (ref 0–40)

## 2022-06-10 LAB — RESP PANEL BY RT-PCR (RSV, FLU A&B, COVID)  RVPGX2
Influenza A by PCR: NEGATIVE
Influenza B by PCR: NEGATIVE
Resp Syncytial Virus by PCR: NEGATIVE
SARS Coronavirus 2 by RT PCR: NEGATIVE

## 2022-06-10 LAB — POCT URINE DRUG SCREEN - MANUAL ENTRY (I-SCREEN)
POC Amphetamine UR: NOT DETECTED
POC Buprenorphine (BUP): NOT DETECTED
POC Cocaine UR: NOT DETECTED
POC Marijuana UR: POSITIVE — AB
POC Methadone UR: NOT DETECTED
POC Methamphetamine UR: NOT DETECTED
POC Morphine: NOT DETECTED
POC Oxazepam (BZO): NOT DETECTED
POC Oxycodone UR: NOT DETECTED
POC Secobarbital (BAR): NOT DETECTED

## 2022-06-10 LAB — POC SARS CORONAVIRUS 2 AG: SARSCOV2ONAVIRUS 2 AG: NEGATIVE

## 2022-06-10 LAB — TSH: TSH: 1.022 u[IU]/mL (ref 0.350–4.500)

## 2022-06-10 MED ORDER — HYDROXYZINE HCL 25 MG PO TABS: 25.0000 mg | ORAL_TABLET | Freq: Three times a day (TID) | ORAL | Status: DC | PRN

## 2022-06-10 MED ORDER — ALUM & MAG HYDROXIDE-SIMETH 200-200-20 MG/5ML PO SUSP: 30.0000 mL | ORAL | Status: DC | PRN

## 2022-06-10 MED ORDER — MAGNESIUM HYDROXIDE 400 MG/5ML PO SUSP: 30.0000 mL | Freq: Every day | ORAL | Status: DC | PRN

## 2022-06-10 MED ORDER — ACETAMINOPHEN 325 MG PO TABS: 650.0000 mg | ORAL_TABLET | Freq: Four times a day (QID) | ORAL | Status: DC | PRN

## 2022-06-10 MED ORDER — ARIPIPRAZOLE 5 MG PO TABS
5.0000 mg | ORAL_TABLET | Freq: Every day | ORAL | Status: DC
  Administered 2022-06-10: 5 mg via ORAL
  Filled 2022-06-10: qty 1

## 2022-06-10 NOTE — ED Notes (Signed)
Pt   denies SI/HI/ endorsing AVH. Calm, cooperative throughout interview process. Skin assessment completed. Oriented to unit. Meal and drink offered. At [place], pt continue to deny  SI/HI endorsing/AVH. Pt verbally contract for safety. Will monitor for safety. Patients belongings are in locker one. He had a necklace,black hat and tennis shoes.

## 2022-06-10 NOTE — ED Notes (Signed)
Pt just arrived to the unit  

## 2022-06-10 NOTE — ED Provider Notes (Addendum)
[facility] Continuous Assessment Admission H&P  Date: 06/10/22 Patient Name: Kristopher Miller MRN: [id number] Chief Complaint:Paranoia Ideations     Diagnoses:  Final diagnoses:  Paranoia (HCC)  Schizophrenia, unspecified type [facility])    HPI: Kristopher Miller 20  42 year old African-American male presents accompanied by [employer] reporting " I need help with my mental."  Reports a history of schizophrenia and posttraumatic stress disorder.  States he was incarcerated in 2013 which is the last time he has taken medications as directed.  Unable to recall the names of the medications that he is currently prescribed.  Chaston stated that he was taken a " blue pill" which he reports helps with his anxiety.  Denies that he is suicidal currently.  Reports chronic auditory and visual hallucinations.  Has a charted history with substance abuse.   Alucard reports he currently resides with his cousin.  States he feels as if people are talking about him when he leaves the room.  States ongoing paranoia related to the neighborhood people talking and watching him. Denies that he is currently employed.  During evaluation Kristopher Miller is sitting in no acute distress.  Resting with his head between his legs talking towards the floor minimal eye contact noted.  He is alert/oriented x 3; calm/cooperative; and mood congruent with affect. He is speaking in a clear tone at moderate volume, and normal pace, circumstantial throughout this assessment. His thought process is coherent and relevant; There is no indication that he is currently responding to internal/external stimuli or experiencing delusional thought content; and has denied suicidal/self-harm/homicidal ideation or psychosis, Patient has remained calm throughout assessment and has answered questions appropriately.       PHQ 2-9:   Flowsheet Row ED from 01/15/2022 in [facility] Emergency Department at [facility] ED from  12/03/2017 in [facility] Emergency Department at [facility] ED from 11/29/2017 in [facility] Emergency Department at [facility]  C-SSRS RISK CATEGORY High Risk High Risk High Risk        Total Time spent with patient: 15 minutes  Musculoskeletal  Strength & Muscle Tone: within normal limits Gait & Station: normal Patient leans: N/A  Psychiatric Specialty Exam  Presentation General Appearance:  Appropriate for Environment  Eye Contact: Minimal  Speech: Clear and Coherent  Speech Volume: Normal  Handedness:Right   Mood and Affect  Mood: Anxious  Affect: Congruent   Thought Process  Thought Processes: Coherent  Descriptions of Associations:Intact  Orientation:Full (Time, Place and Person)  Thought Content:Logical  Diagnosis of Schizophrenia or Schizoaffective disorder in past: Yes  Duration of Psychotic Symptoms: Greater than six months  Hallucinations:Hallucinations: None  Ideas of Reference:None  Suicidal Thoughts:Suicidal Thoughts: No  Homicidal Thoughts:Homicidal Thoughts: No   Sensorium  Memory: Recent Fair; Remote Fair; Immediate Good  Judgment: Fair  Insight: Fair   Art therapist  Concentration: Fair  Attention Span: Fair  Recall: Fair  Fund of Knowledge: Fair  Language: Good   Psychomotor Activity  Psychomotor Activity:Psychomotor Activity: Normal   Assets  Assets: Desire for Improvement; Communication Skills; Social Support   Sleep  Sleep:Sleep: Fair   Nutritional Assessment (For OBS and FBC admissions only) Has the patient had a weight loss or gain of 10 pounds or more in the last 3 months?: No Has the patient had a decrease in food intake/or appetite?: No Does the patient have dental problems?: No Does the patient have eating habits or behaviors that may be indicators of  an eating disorder including binging or inducing vomiting?: No Has the patient recently lost weight without  trying?: 0 Has the patient been eating poorly because of a decreased appetite?: 0 Malnutrition Screening Tool Score: 0    Physical Exam Vitals and nursing note reviewed.  Cardiovascular:     Rate and Rhythm: Normal rate and regular rhythm.  Pulmonary:     Effort: Pulmonary effort is normal.     Breath sounds: Normal breath sounds.  Neurological:     Mental Status: He is alert and oriented to person, place, and time.  Psychiatric:        Mood and Affect: Mood normal.        Behavior: Behavior normal.        Thought Content: Thought content normal.    Review of Systems  Eyes: Negative.   Cardiovascular: Negative.   Psychiatric/Behavioral:  Positive for depression and substance abuse. The patient is nervous/anxious.   All other systems reviewed and are negative.   Blood pressure 135/89, pulse 100, temperature 97.8 F (36.6 C), temperature source Oral, resp. rate 18, SpO2 97 %. There is no height or weight on file to calculate BMI.  Past Psychiatric History:   Is the patient at risk to self? No  Has the patient been a risk to self in the past 6 months? No .    Has the patient been a risk to self within the distant past? No   Is the patient a risk to others? No   Has the patient been a risk to others in the past 6 months? No   Has the patient been a risk to others within the distant past? No   Past Medical History:  Past Medical History:  Diagnosis Date   Schizophrenia (Friend)    No past surgical history on file.  Family History: No family history on file.  Social History:  Social History   Socioeconomic History   Marital status: Single    Spouse name: Not on file   Number of children: Not on file   Years of education: Not on file   Highest education level: Not on file  Occupational History   Not on file  Tobacco Use   Smoking status: Every Day   Smokeless tobacco: Never  Substance and Sexual Activity   Alcohol use: Yes    Comment: occ   Drug use: Yes     Types: Marijuana   Sexual activity: Not on file  Other Topics Concern   Not on file  Social History Narrative   Not on file   Social Determinants of Health   Financial Resource Strain: Not on file  Food Insecurity: Not on file  Transportation Needs: Not on file  Physical Activity: Not on file  Stress: Not on file  Social Connections: Not on file  Intimate Partner Violence: Not on file    SDOH:  SDOH Screenings   Tobacco Use: High Risk (01/15/2022)    Last Labs:  Admission on 01/15/2022, Discharged on 01/16/2022  Component Date Value Ref Range Status   SARS Coronavirus 2 by RT PCR 01/15/2022 NEGATIVE  NEGATIVE Final   Comment: (NOTE) SARS-CoV-2 target nucleic acids are NOT DETECTED.  The SARS-CoV-2 RNA is generally detectable in upper respiratory specimens during the acute phase of infection. The lowest concentration of SARS-CoV-2 viral copies this assay can detect is 138 copies/mL. A negative result does not preclude SARS-Cov-2 infection and should not be used as the sole basis for treatment or  other patient management decisions. A negative result may occur with  improper specimen collection/handling, submission of specimen other than nasopharyngeal swab, presence of viral mutation(s) within the areas targeted by this assay, and inadequate number of viral copies(<138 copies/mL). A negative result must be combined with clinical observations, patient history, and epidemiological information. The expected result is Negative.  Fact Sheet for Patients:  BloggerCourse.com  Fact Sheet for Healthcare Providers:  SeriousBroker.it  This test is no                          t yet approved or cleared by the [place] FDA and  has been authorized for detection and/or diagnosis of SARS-CoV-2 by FDA under an Emergency Use Authorization (EUA). This EUA will remain  in effect (meaning this test can be used) for the duration of  the COVID-19 declaration under Section 564(b)(1) of the Act, 21 U.S.C.section 360bbb-3(b)(1), unless the authorization is terminated  or revoked sooner.       Influenza A by PCR 01/15/2022 NEGATIVE  NEGATIVE Final   Influenza B by PCR 01/15/2022 NEGATIVE  NEGATIVE Final   Comment: (NOTE) The Xpert Xpress SARS-CoV-2/FLU/RSV plus assay is intended as an aid in the diagnosis of influenza from Nasopharyngeal swab specimens and should not be used as a sole basis for treatment. Nasal washings and aspirates are unacceptable for Xpert Xpress SARS-CoV-2/FLU/RSV testing.  Fact Sheet for Patients: BloggerCourse.com  Fact Sheet for Healthcare Providers: SeriousBroker.it  This test is not yet approved or cleared by the [place] FDA and has been authorized for detection and/or diagnosis of SARS-CoV-2 by FDA under an Emergency Use Authorization (EUA). This EUA will remain in effect (meaning this test can be used) for the duration of the COVID-19 declaration under Section 564(b)(1) of the Act, 21 U.S.C. section 360bbb-3(b)(1), unless the authorization is terminated or revoked.  Performed at [facility], [address] [place], [place] [address]    Sodium 01/15/2022 139  135 - 145 mmol/L Final   Potassium 01/15/2022 3.8  3.5 - 5.1 mmol/L Final   Chloride 01/15/2022 107  98 - 111 mmol/L Final   CO2 01/15/2022 23  22 - 32 mmol/L Final   Glucose, Bld 01/15/2022 187 (H)  70 - 99 mg/dL Final   Glucose reference range applies only to samples taken after fasting for at least 8 hours.   BUN 01/15/2022 12  6 - 20 mg/dL Final   Creatinine, Ser 01/15/2022 1.16  0.61 - 1.24 mg/dL Final   Calcium 49/44/9675 9.0  8.9 - 10.3 mg/dL Final   Total Protein 91/63/8466 6.7  6.5 - 8.1 g/dL Final   Albumin 59/93/5701 3.9  3.5 - 5.0 g/dL Final   AST 77/93/9030 25  15 - 41 U/L Final   ALT 01/15/2022 15  0 - 44 U/L Final   Alkaline Phosphatase  01/15/2022 50  38 - 126 U/L Final   Total Bilirubin 01/15/2022 0.6  0.3 - 1.2 mg/dL Final   GFR, Estimated 01/15/2022 >60  >60 mL/min Final   Comment: (NOTE) Calculated using the CKD-EPI Creatinine Equation (2021)    Anion gap 01/15/2022 9  5 - 15 Final   Performed at [facility], [address], [place], [place] [address]   Alcohol, Ethyl (B) 01/15/2022 <10  <10 mg/dL Final   Comment: (NOTE) Lowest detectable limit for serum alcohol is 10 mg/dL.  For medical purposes only. Performed at [facility],  [address] [place], [place] [address]    Opiates 01/15/2022 NONE DETECTED  NONE DETECTED Final   Cocaine 01/15/2022 POSITIVE (A)  NONE DETECTED Final   Benzodiazepines 01/15/2022 NONE DETECTED  NONE DETECTED Final   Amphetamines 01/15/2022 NONE DETECTED  NONE DETECTED Final   Tetrahydrocannabinol 01/15/2022 POSITIVE (A)  NONE DETECTED Final   Barbiturates 01/15/2022 NONE DETECTED  NONE DETECTED Final   Comment: (NOTE) DRUG SCREEN FOR MEDICAL PURPOSES ONLY.  IF CONFIRMATION IS NEEDED FOR ANY PURPOSE, NOTIFY LAB WITHIN 5 DAYS.  LOWEST DETECTABLE LIMITS FOR URINE DRUG SCREEN Drug Class                     Cutoff (ng/mL) Amphetamine and metabolites    1000 Barbiturate and metabolites    200 Benzodiazepine                 735 Tricyclics and metabolites     300 Opiates and metabolites        300 Cocaine and metabolites        300 THC                            50 Performed at [facility], [place] [address], [place], [place] [address]    WBC 01/15/2022 7.4  4.0 - 10.5 K/uL Final   RBC 01/15/2022 4.59  4.22 - 5.81 MIL/uL Final   Hemoglobin 01/15/2022 14.5  13.0 - 17.0 g/dL Final   HCT 01/15/2022 44.2  39.0 - 52.0 % Final   MCV 01/15/2022 96.3  80.0 - 100.0 fL Final   MCH 01/15/2022 31.6  26.0 - 34.0 pg Final   MCHC 01/15/2022 32.8  30.0 - 36.0 g/dL Final   RDW 01/15/2022 11.9  11.5 - 15.5 % Final   Platelets 01/15/2022 227  150 - 400 K/uL Final   nRBC  01/15/2022 0.0  0.0 - 0.2 % Final   Neutrophils Relative % 01/15/2022 74  % Final   Neutro Abs 01/15/2022 5.4  1.7 - 7.7 K/uL Final   Lymphocytes Relative 01/15/2022 17  % Final   Lymphs Abs 01/15/2022 1.2  0.7 - 4.0 K/uL Final   Monocytes Relative 01/15/2022 9  % Final   Monocytes Absolute 01/15/2022 0.7  0.1 - 1.0 K/uL Final   Eosinophils Relative 01/15/2022 0  % Final   Eosinophils Absolute 01/15/2022 0.0  0.0 - 0.5 K/uL Final   Basophils Relative 01/15/2022 0  % Final   Basophils Absolute 01/15/2022 0.0  0.0 - 0.1 K/uL Final   Immature Granulocytes 01/15/2022 0  % Final   Abs Immature Granulocytes 01/15/2022 0.02  0.00 - 0.07 K/uL Final   Performed at [facility], [place] [address] [place], [place] [address]   Acetaminophen (Tylenol), Serum 01/15/2022 <10 (L)  10 - 30 ug/mL Final   Comment: (NOTE) Therapeutic concentrations vary significantly. A range of 10-30 ug/mL  may be an effective concentration for many patients. However, some  are best treated at concentrations outside of this range. Acetaminophen concentrations >150 ug/mL at 4 hours after ingestion  and >50 ug/mL at 12 hours after ingestion are often associated with  toxic reactions.  Performed at [facility], [place] [address] [place], [place] [address]    Salicylate Lvl 22/97/9892 <7.0 (L)  7.0 - 30.0 mg/dL Final   Performed at [facility] [address], [place], [place] [address]  Allergies: Patient has no known allergies.  PTA Medications: (Not in a hospital admission)   Medical Decision Making  Overnight observation Will restart Abilify 5 mg for mood stabilization    Recommendations  Based on my evaluation the patient does not appear to have an emergency medical condition.  Oneta Rack, NP 06/10/22  9:38 AM

## 2022-06-10 NOTE — Discharge Instructions (Addendum)

## 2022-06-10 NOTE — ED Notes (Signed)
[facility] lab courier called to transport labs to [facility] lab 
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
## 2022-06-10 NOTE — ED Notes (Signed)
Patient was walked out by [facility] williams.
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
## 2022-06-10 NOTE — ED Notes (Signed)
Patient entered the unit.
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
## 2022-06-11 NOTE — ED Notes (Addendum)
Patient A&O x 4, ambulatory. Patient discharged in no acute distress. Patient denied SI/HI,upon discharge .Pt belongings returned intact. Patient escorted to lobby via  [facility]  for transport to destination. Safety maintained.

## 2022-06-11 NOTE — ED Notes (Signed)
Patient is in flex with provider and AC .

## 2023-01-06 ENCOUNTER — Encounter (HOSPITAL_COMMUNITY): Payer: Self-pay

## 2023-01-06 ENCOUNTER — Emergency Department (HOSPITAL_COMMUNITY)
Admission: EM | Admit: 2023-01-06 | Discharge: 2023-01-07 | Disposition: A | Discharge status: Home / Self Care | Payer: Medicaid Other | Attending: Emergency Medicine | Admitting: Emergency Medicine

## 2023-01-06 ENCOUNTER — Emergency Department (HOSPITAL_COMMUNITY): Payer: Medicaid Other

## 2023-01-06 ENCOUNTER — Other Ambulatory Visit: Payer: Self-pay

## 2023-01-06 DIAGNOSIS — Z23 Encounter for immunization: Secondary | ICD-10-CM | POA: Insufficient documentation

## 2023-01-06 DIAGNOSIS — S6992XA Unspecified injury of left wrist, hand and finger(s), initial encounter: Secondary | ICD-10-CM | POA: Insufficient documentation

## 2023-01-06 DIAGNOSIS — W503XXA Accidental bite by another person, initial encounter: Secondary | ICD-10-CM
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
## 2023-01-07 DIAGNOSIS — W503XXA Accidental bite by another person, initial encounter: Secondary | ICD-10-CM

## 2023-01-07 MED ORDER — NAPROXEN 500 MG PO TABS: 500.0000 mg | ORAL_TABLET | Freq: Two times a day (BID) | ORAL | 0 refills | Status: DC

## 2023-01-07 MED ORDER — NAPROXEN 250 MG PO TABS
500.0000 mg | ORAL_TABLET | Freq: Once | ORAL | Status: AC
  Administered 2023-01-07: 500 mg via ORAL
  Filled 2023-01-07: qty 2

## 2023-01-07 MED ORDER — TETANUS-DIPHTH-ACELL PERTUSSIS 5-2.5-18.5 LF-MCG/0.5 IM SUSY
0.5000 mL | PREFILLED_SYRINGE | Freq: Once | INTRAMUSCULAR | Status: AC
  Administered 2023-01-07: 0.5 mL via INTRAMUSCULAR
  Filled 2023-01-07: qty 0.5

## 2023-01-07 MED ORDER — ACETAMINOPHEN 500 MG PO TABS
1000.0000 mg | ORAL_TABLET | Freq: Once | ORAL | Status: AC
  Administered 2023-01-07: 1000 mg via ORAL
  Filled 2023-01-07: qty 2

## 2023-01-07 MED ORDER — AMOXICILLIN-POT CLAVULANATE 875-125 MG PO TABS
1.0000 | ORAL_TABLET | Freq: Once | ORAL | Status: AC
  Administered 2023-01-07: 1 via ORAL
  Filled 2023-01-07: qty 1

## 2023-01-07 MED ORDER — AMOXICILLIN-POT CLAVULANATE 875-125 MG PO TABS: 1.0000 | ORAL_TABLET | Freq: Two times a day (BID) | ORAL | 0 refills | Status: DC
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
## 2023-01-07 NOTE — ED Notes (Signed)
Sister Tymarion Morre [phone] would like an update asap
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
## 2023-01-11 ENCOUNTER — Emergency Department (HOSPITAL_COMMUNITY): Payer: Medicaid Other

## 2023-01-11 ENCOUNTER — Other Ambulatory Visit: Payer: Self-pay

## 2023-01-11 ENCOUNTER — Encounter (HOSPITAL_COMMUNITY): Payer: Self-pay

## 2023-01-11 ENCOUNTER — Other Ambulatory Visit: Payer: Self-pay | Admitting: Orthopedic Surgery

## 2023-01-11 ENCOUNTER — Inpatient Hospital Stay (HOSPITAL_COMMUNITY)
Admission: EM | Admit: 2023-01-11 | Discharge: 2023-01-16 | DRG: 854 | Disposition: A | Discharge status: Home Health | Payer: Medicaid Other | Source: Ambulatory Visit | Attending: Internal Medicine | Admitting: Internal Medicine

## 2023-01-11 DIAGNOSIS — M009 Pyogenic arthritis, unspecified: Secondary | ICD-10-CM | POA: Diagnosis present

## 2023-01-11 DIAGNOSIS — F209 Schizophrenia, unspecified: Secondary | ICD-10-CM | POA: Diagnosis present

## 2023-01-11 DIAGNOSIS — W503XXD Accidental bite by another person, subsequent encounter: Secondary | ICD-10-CM | POA: Diagnosis not present

## 2023-01-11 DIAGNOSIS — L02512 Cutaneous abscess of left hand: Secondary | ICD-10-CM | POA: Diagnosis present

## 2023-01-11 DIAGNOSIS — B9689 Other specified bacterial agents as the cause of diseases classified elsewhere: Secondary | ICD-10-CM | POA: Diagnosis present

## 2023-01-11 DIAGNOSIS — S61452A Open bite of left hand, initial encounter: Secondary | ICD-10-CM | POA: Diagnosis present

## 2023-01-11 DIAGNOSIS — A419 Sepsis, unspecified organism: Principal | ICD-10-CM | POA: Diagnosis present

## 2023-01-11 DIAGNOSIS — R680 Hypothermia, not associated with low environmental temperature: Secondary | ICD-10-CM | POA: Diagnosis present

## 2023-01-11 DIAGNOSIS — L03012 Cellulitis of left finger: Secondary | ICD-10-CM | POA: Diagnosis not present

## 2023-01-11 DIAGNOSIS — L0390 Cellulitis, unspecified: Secondary | ICD-10-CM | POA: Diagnosis not present

## 2023-01-11 DIAGNOSIS — B957 Other staphylococcus as the cause of diseases classified elsewhere: Secondary | ICD-10-CM | POA: Diagnosis present

## 2023-01-11 DIAGNOSIS — L03114 Cellulitis of left upper limb: Secondary | ICD-10-CM | POA: Diagnosis present

## 2023-01-11 DIAGNOSIS — M7989 Other specified soft tissue disorders: Secondary | ICD-10-CM

## 2023-01-11 DIAGNOSIS — I96 Gangrene, not elsewhere classified: Secondary | ICD-10-CM | POA: Diagnosis present

## 2023-01-11 DIAGNOSIS — F17200 Nicotine dependence, unspecified, uncomplicated: Secondary | ICD-10-CM | POA: Diagnosis present

## 2023-01-11 DIAGNOSIS — Z79899 Other long term (current) drug therapy: Secondary | ICD-10-CM | POA: Diagnosis not present

## 2023-01-11 DIAGNOSIS — W503XXA Accidental bite by another person, initial encounter: Secondary | ICD-10-CM

## 2023-01-11 DIAGNOSIS — F4310 Post-traumatic stress disorder, unspecified: Secondary | ICD-10-CM | POA: Diagnosis present

## 2023-01-11 LAB — CBC WITH DIFFERENTIAL/PLATELET
Abs Immature Granulocytes: 0.04 10*3/uL (ref 0.00–0.07)
Basophils Absolute: 0.1 10*3/uL (ref 0.0–0.1)
Basophils Relative: 1 %
Eosinophils Absolute: 0.1 10*3/uL (ref 0.0–0.5)
Eosinophils Relative: 2 %
HCT: 41.7 % (ref 39.0–52.0)
Hemoglobin: 13.7 g/dL (ref 13.0–17.0)
Immature Granulocytes: 0 %
Lymphocytes Relative: 17 %
Lymphs Abs: 1.6 10*3/uL (ref 0.7–4.0)
MCH: 31.9 pg (ref 26.0–34.0)
MCHC: 32.9 g/dL (ref 30.0–36.0)
MCV: 97 fL (ref 80.0–100.0)
Monocytes Absolute: 1 10*3/uL (ref 0.1–1.0)
Monocytes Relative: 11 %
Neutro Abs: 6.2 10*3/uL (ref 1.7–7.7)
Neutrophils Relative %: 69 %
Platelets: 222 10*3/uL (ref 150–400)
RBC: 4.3 MIL/uL (ref 4.22–5.81)
RDW: 11.7 % (ref 11.5–15.5)
WBC: 9 10*3/uL (ref 4.0–10.5)
nRBC: 0 % (ref 0.0–0.2)

## 2023-01-11 LAB — BASIC METABOLIC PANEL
Anion gap: 10 (ref 5–15)
BUN: 14 mg/dL (ref 6–20)
CO2: 24 mmol/L (ref 22–32)
Calcium: 8.7 mg/dL — ABNORMAL LOW (ref 8.9–10.3)
Chloride: 101 mmol/L (ref 98–111)
Creatinine, Ser: 0.77 mg/dL (ref 0.61–1.24)
GFR, Estimated: >60 mL/min (ref >60)
Glucose, Bld: 95 mg/dL (ref 70–99)
Potassium: 3.5 mmol/L (ref 3.5–5.1)
Sodium: 135 mmol/L (ref 135–145)

## 2023-01-11 LAB — LACTIC ACID, PLASMA
Lactic Acid, Venous: 1.1 mmol/L (ref 0.5–1.9)
Lactic Acid, Venous: 0.8 mmol/L (ref 0.5–1.9)

## 2023-01-11 MED ORDER — KETOROLAC TROMETHAMINE 15 MG/ML IJ SOLN
15.0000 mg | Freq: Once | INTRAMUSCULAR | Status: AC
  Administered 2023-01-11: 15 mg via INTRAVENOUS
  Filled 2023-01-11: qty 1

## 2023-01-11 MED ORDER — MORPHINE SULFATE (PF) 2 MG/ML IV SOLN: 2.0000 mg | INTRAVENOUS | Status: DC | PRN

## 2023-01-11 MED ORDER — OLANZAPINE 10 MG PO TABS
40.0000 mg | ORAL_TABLET | Freq: Every day | ORAL | Status: DC
  Administered 2023-01-11 – 2023-01-15 (×5): 40 mg via ORAL
  Filled 2023-01-11 (×5): qty 4

## 2023-01-11 MED ORDER — SODIUM CHLORIDE 0.9 % IV BOLUS
500.0000 mL | Freq: Once | INTRAVENOUS | Status: AC
  Administered 2023-01-11: 500 mL via INTRAVENOUS

## 2023-01-11 MED ORDER — PIPERACILLIN-TAZOBACTAM 3.375 G IVPB 30 MIN
3.3750 g | Freq: Once | INTRAVENOUS | Status: AC
  Administered 2023-01-11: 3.375 g via INTRAVENOUS
  Filled 2023-01-11: qty 50

## 2023-01-11 MED ORDER — PIPERACILLIN-TAZOBACTAM 3.375 G IVPB
3.3750 g | Freq: Three times a day (TID) | INTRAVENOUS | Status: DC
  Administered 2023-01-12 – 2023-01-15 (×11): 3.375 g via INTRAVENOUS
  Filled 2023-01-11 (×11): qty 50

## 2023-01-11 MED ORDER — TRAZODONE HCL 50 MG PO TABS
50.0000 mg | ORAL_TABLET | Freq: Every day | ORAL | Status: DC
  Administered 2023-01-11 – 2023-01-15 (×5): 50 mg via ORAL
  Filled 2023-01-11 (×5): qty 1

## 2023-01-11 MED ORDER — HYDROCODONE-ACETAMINOPHEN 5-325 MG PO TABS
1.0000 | ORAL_TABLET | Freq: Four times a day (QID) | ORAL | Status: DC | PRN
  Administered 2023-01-12 – 2023-01-16 (×9): 1 via ORAL
  Filled 2023-01-11 (×9): qty 1

## 2023-01-11 MED ORDER — HYDROMORPHONE HCL 1 MG/ML IJ SOLN
1.0000 mg | Freq: Once | INTRAMUSCULAR | Status: AC
  Administered 2023-01-11: 1 mg via INTRAVENOUS
  Filled 2023-01-11: qty 1

## 2023-01-11 MED ORDER — VANCOMYCIN HCL 1500 MG/300ML IV SOLN
1500.0000 mg | Freq: Once | INTRAVENOUS | Status: DC
  Filled 2023-01-11: qty 300

## 2023-01-11 MED ORDER — VANCOMYCIN HCL 1500 MG/300ML IV SOLN
1500.0000 mg | Freq: Two times a day (BID) | INTRAVENOUS | Status: DC
  Administered 2023-01-11 – 2023-01-13 (×4): 1500 mg via INTRAVENOUS
  Filled 2023-01-11 (×6): qty 300

## 2023-01-11 NOTE — Assessment & Plan Note (Addendum)
-  stable mood -continue Zyprexa and trazodone

## 2023-01-11 NOTE — ED Notes (Signed)
Receiving floor notified of transport to to floor for admission
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
## 2023-01-11 NOTE — Progress Notes (Signed)
Pharmacy Antibiotic Note  Kristopher Miller is a 42 y.o. male admitted on 01/11/2023 with cellulitis. Pt has a left hand wound and has planned irrigation and debridement either tonight or tomorrow AM. WBC wnl, afebrile. Pharmacy has been consulted for vancomycin and zosyn dosing.  Plan: Zosyn 3.375 g IV every 8 hours Vancomycin 1500 mg IV every 12 hours (eAUC 505)  Monitor WBC, temperature, and renal function F/u OR plans and LOT    Height: 5\' 10"  (177.8 cm) Weight: 76.7 kg (169 lb) IBW/kg (Calculated) : 73  Temp (24hrs), Avg:98.5 F (36.9 C), Min:98.5 F (36.9 C), Max:98.5 F (36.9 C)  Recent Labs  Lab 01/11/23 1715 01/11/23 1946  WBC 9.0  --   CREATININE 0.77  --   LATICACIDVEN 1.1 0.8    Estimated Creatinine Clearance: 124.2 mL/min (by C-G formula based on SCr of 0.77 mg/dL).    No Known Allergies  Antimicrobials this admission: 8/22 vancomycin >>  8/22 zosyn >>   Dose adjustments this admission:   Microbiology results: 8/22 BCx:    Thank you for allowing pharmacy to be a part of this patient's care.  Griffin Dakin 01/11/2023 8:53 PM

## 2023-01-12 ENCOUNTER — Inpatient Hospital Stay (HOSPITAL_COMMUNITY): Payer: Medicaid Other | Admitting: Anesthesiology

## 2023-01-12 ENCOUNTER — Encounter (HOSPITAL_COMMUNITY): Payer: Self-pay | Admitting: Family Medicine

## 2023-01-12 ENCOUNTER — Encounter (HOSPITAL_COMMUNITY)
Admission: EM | Disposition: A | Discharge status: Home Health | Payer: Self-pay | Source: Ambulatory Visit | Attending: Internal Medicine

## 2023-01-12 ENCOUNTER — Other Ambulatory Visit: Payer: Self-pay

## 2023-01-12 DIAGNOSIS — L03012 Cellulitis of left finger: Secondary | ICD-10-CM | POA: Diagnosis not present

## 2023-01-12 DIAGNOSIS — L0390 Cellulitis, unspecified: Secondary | ICD-10-CM | POA: Diagnosis not present

## 2023-01-12 HISTORY — PX: I & D EXTREMITY: SHX5045

## 2023-01-12 LAB — CBC
HCT: 37.9 % — ABNORMAL LOW (ref 39.0–52.0)
Hemoglobin: 12.5 g/dL — ABNORMAL LOW (ref 13.0–17.0)
MCH: 30.9 pg (ref 26.0–34.0)
MCHC: 33 g/dL (ref 30.0–36.0)
MCV: 93.8 fL (ref 80.0–100.0)
Platelets: 198 10*3/uL (ref 150–400)
RBC: 4.04 MIL/uL — ABNORMAL LOW (ref 4.22–5.81)
RDW: 11.5 % (ref 11.5–15.5)
WBC: 6.9 10*3/uL (ref 4.0–10.5)
nRBC: 0 % (ref 0.0–0.2)

## 2023-01-12 LAB — BASIC METABOLIC PANEL
Anion gap: 9 (ref 5–15)
BUN: 11 mg/dL (ref 6–20)
CO2: 25 mmol/L (ref 22–32)
Calcium: 8.1 mg/dL — ABNORMAL LOW (ref 8.9–10.3)
Chloride: 102 mmol/L (ref 98–111)
Creatinine, Ser: 0.91 mg/dL (ref 0.61–1.24)
GFR, Estimated: >60 mL/min (ref >60)
Glucose, Bld: 99 mg/dL (ref 70–99)
Potassium: 3.3 mmol/L — ABNORMAL LOW (ref 3.5–5.1)
Sodium: 136 mmol/L (ref 135–145)

## 2023-01-12 LAB — HIV ANTIBODY (ROUTINE TESTING W REFLEX): HIV Screen 4th Generation wRfx: NONREACTIVE

## 2023-01-12 LAB — SURGICAL PCR SCREEN
MRSA, PCR: NEGATIVE
Staphylococcus aureus: POSITIVE — AB

## 2023-01-12 SURGERY — IRRIGATION AND DEBRIDEMENT EXTREMITY
Anesthesia: General | Laterality: Left

## 2023-01-12 MED ORDER — PROPOFOL 10 MG/ML IV BOLUS
INTRAVENOUS | Status: AC
  Filled 2023-01-12: qty 20

## 2023-01-12 MED ORDER — ONDANSETRON HCL 4 MG/2ML IJ SOLN
INTRAMUSCULAR | Status: AC
  Filled 2023-01-12: qty 2

## 2023-01-12 MED ORDER — LIDOCAINE 2% (20 MG/ML) 5 ML SYRINGE
INTRAMUSCULAR | Status: AC
  Filled 2023-01-12: qty 5

## 2023-01-12 MED ORDER — FENTANYL CITRATE (PF) 250 MCG/5ML IJ SOLN
INTRAMUSCULAR | Status: DC | PRN
  Administered 2023-01-12: 50 ug via INTRAVENOUS

## 2023-01-12 MED ORDER — OXYCODONE HCL 5 MG PO TABS
5.0000 mg | ORAL_TABLET | Freq: Once | ORAL | Status: AC
  Administered 2023-01-12: 5 mg via ORAL

## 2023-01-12 MED ORDER — OXYCODONE HCL 5 MG PO TABS
ORAL_TABLET | ORAL | Status: AC
  Filled 2023-01-12: qty 1

## 2023-01-12 MED ORDER — MIDAZOLAM HCL 2 MG/2ML IJ SOLN
INTRAMUSCULAR | Status: DC | PRN
  Administered 2023-01-12: 2 mg via INTRAVENOUS

## 2023-01-12 MED ORDER — DEXAMETHASONE SODIUM PHOSPHATE 10 MG/ML IJ SOLN
INTRAMUSCULAR | Status: DC | PRN
  Administered 2023-01-12: 10 mg via INTRAVENOUS

## 2023-01-12 MED ORDER — HYDROMORPHONE HCL 1 MG/ML IJ SOLN: 0.2500 mg | INTRAMUSCULAR | Status: DC | PRN

## 2023-01-12 MED ORDER — POTASSIUM CHLORIDE CRYS ER 20 MEQ PO TBCR
40.0000 meq | EXTENDED_RELEASE_TABLET | Freq: Once | ORAL | Status: AC
  Administered 2023-01-12: 40 meq via ORAL
  Filled 2023-01-12: qty 2

## 2023-01-12 MED ORDER — EPHEDRINE SULFATE-NACL 50-0.9 MG/10ML-% IV SOSY
PREFILLED_SYRINGE | INTRAVENOUS | Status: DC | PRN
  Administered 2023-01-12: 5 mg via INTRAVENOUS

## 2023-01-12 MED ORDER — EPHEDRINE 5 MG/ML INJ
INTRAVENOUS | Status: AC
  Filled 2023-01-12: qty 5

## 2023-01-12 MED ORDER — MIDAZOLAM HCL 2 MG/2ML IJ SOLN: 0.5000 mg | Freq: Once | INTRAMUSCULAR | Status: DC | PRN

## 2023-01-12 MED ORDER — PROPOFOL 10 MG/ML IV BOLUS
INTRAVENOUS | Status: DC | PRN
  Administered 2023-01-12: 200 mg via INTRAVENOUS

## 2023-01-12 MED ORDER — PROMETHAZINE HCL 25 MG/ML IJ SOLN: 6.2500 mg | INTRAMUSCULAR | Status: DC | PRN

## 2023-01-12 MED ORDER — LIDOCAINE HCL 1 % IJ SOLN
INTRAMUSCULAR | Status: DC | PRN
  Administered 2023-01-12: 10 mL via INTRADERMAL

## 2023-01-12 MED ORDER — OXYCODONE HCL 5 MG/5ML PO SOLN: 5.0000 mg | Freq: Once | ORAL | Status: DC | PRN

## 2023-01-12 MED ORDER — ORAL CARE MOUTH RINSE: 15.0000 mL | Freq: Once | OROMUCOSAL | Status: AC

## 2023-01-12 MED ORDER — CHLORHEXIDINE GLUCONATE 0.12 % MT SOLN
OROMUCOSAL | Status: AC
  Filled 2023-01-12: qty 15

## 2023-01-12 MED ORDER — OXYCODONE HCL 5 MG PO TABS: 5.0000 mg | ORAL_TABLET | Freq: Once | ORAL | Status: DC | PRN

## 2023-01-12 MED ORDER — MIDAZOLAM HCL 2 MG/2ML IJ SOLN
INTRAMUSCULAR | Status: AC
  Filled 2023-01-12: qty 2

## 2023-01-12 MED ORDER — PHENYLEPHRINE 80 MCG/ML (10ML) SYRINGE FOR IV PUSH (FOR BLOOD PRESSURE SUPPORT)
PREFILLED_SYRINGE | INTRAVENOUS | Status: AC
  Filled 2023-01-12: qty 10

## 2023-01-12 MED ORDER — CHLORHEXIDINE GLUCONATE 0.12 % MT SOLN: 15.0000 mL | Freq: Once | OROMUCOSAL | Status: AC

## 2023-01-12 MED ORDER — LACTATED RINGERS IV SOLN: INTRAVENOUS | Status: DC

## 2023-01-12 MED ORDER — LIDOCAINE 2% (20 MG/ML) 5 ML SYRINGE
INTRAMUSCULAR | Status: DC | PRN
  Administered 2023-01-12: 30 mg via INTRAVENOUS

## 2023-01-12 MED ORDER — BUPIVACAINE HCL (PF) 0.25 % IJ SOLN
INTRAMUSCULAR | Status: AC
  Filled 2023-01-12: qty 30

## 2023-01-12 MED ORDER — GLYCOPYRROLATE PF 0.2 MG/ML IJ SOSY
PREFILLED_SYRINGE | INTRAMUSCULAR | Status: DC | PRN
  Administered 2023-01-12: .1 mg via INTRAVENOUS

## 2023-01-12 MED ORDER — MEPERIDINE HCL 25 MG/ML IJ SOLN: 6.2500 mg | INTRAMUSCULAR | Status: DC | PRN

## 2023-01-12 MED ORDER — FENTANYL CITRATE (PF) 250 MCG/5ML IJ SOLN
INTRAMUSCULAR | Status: AC
  Filled 2023-01-12: qty 5

## 2023-01-12 MED ORDER — ONDANSETRON HCL 4 MG/2ML IJ SOLN
INTRAMUSCULAR | Status: DC | PRN
  Administered 2023-01-12: 4 mg via INTRAVENOUS

## 2023-01-12 MED ORDER — PHENYLEPHRINE 80 MCG/ML (10ML) SYRINGE FOR IV PUSH (FOR BLOOD PRESSURE SUPPORT)
PREFILLED_SYRINGE | INTRAVENOUS | Status: DC | PRN
  Administered 2023-01-12: 120 ug via INTRAVENOUS
  Administered 2023-01-12: 80 ug via INTRAVENOUS
  Administered 2023-01-12: 160 ug via INTRAVENOUS
  Administered 2023-01-12: 120 ug via INTRAVENOUS
  Administered 2023-01-12: 80 ug via INTRAVENOUS
  Administered 2023-01-12: 160 ug via INTRAVENOUS

## 2023-01-12 MED ORDER — DEXAMETHASONE SODIUM PHOSPHATE 10 MG/ML IJ SOLN
INTRAMUSCULAR | Status: AC
  Filled 2023-01-12: qty 1

## 2023-01-12 MED ORDER — LIDOCAINE HCL 1 % IJ SOLN
INTRAMUSCULAR | Status: AC
  Filled 2023-01-12: qty 20

## 2023-01-12 SURGICAL SUPPLY — 49 items
BAG COUNTER SPONGE SURGICOUNT (BAG) ×1 IMPLANT
BAG SPNG CNTER NS LX DISP (BAG) ×1
BNDG CMPR 5X3 KNIT ELC UNQ LF (GAUZE/BANDAGES/DRESSINGS) ×1
BNDG CMPR 75X21 PLY HI ABS (MISCELLANEOUS)
BNDG CMPR 9X4 STRL LF SNTH (GAUZE/BANDAGES/DRESSINGS)
BNDG ELASTIC 3INX 5YD STR LF (GAUZE/BANDAGES/DRESSINGS) ×1 IMPLANT
BNDG ELASTIC 4X5.8 VLCR STR LF (GAUZE/BANDAGES/DRESSINGS) ×1 IMPLANT
BNDG ESMARK 4X9 LF (GAUZE/BANDAGES/DRESSINGS) IMPLANT
BNDG GAUZE DERMACEA FLUFF 4 (GAUZE/BANDAGES/DRESSINGS) ×2 IMPLANT
BNDG GZE DERMACEA 4 6PLY (GAUZE/BANDAGES/DRESSINGS) ×2
CORD BIPOLAR FORCEPS 12FT (ELECTRODE) IMPLANT
COVER SURGICAL LIGHT HANDLE (MISCELLANEOUS) ×2 IMPLANT
CUFF TOURN SGL QUICK 18X4 (TOURNIQUET CUFF) ×1 IMPLANT
DRAPE SURG 17X23 STRL (DRAPES) ×1 IMPLANT
DURAPREP 26ML APPLICATOR (WOUND CARE) ×1 IMPLANT
GAUZE PACKING IODOFORM 1/4X15 (PACKING) IMPLANT
GAUZE SPONGE 4X4 12PLY STRL (GAUZE/BANDAGES/DRESSINGS) ×2 IMPLANT
GAUZE STRETCH 2X75IN STRL (MISCELLANEOUS) IMPLANT
GAUZE XEROFORM 1X8 LF (GAUZE/BANDAGES/DRESSINGS) ×1 IMPLANT
GLOVE SURG SYN 8.0 (GLOVE) ×1 IMPLANT
GLOVE SURG SYN 8.0 PF PI (GLOVE) ×1 IMPLANT
GOWN STRL REUS W/ TWL LRG LVL3 (GOWN DISPOSABLE) ×1 IMPLANT
GOWN STRL REUS W/ TWL XL LVL3 (GOWN DISPOSABLE) ×1 IMPLANT
GOWN STRL REUS W/TWL LRG LVL3 (GOWN DISPOSABLE) ×1
GOWN STRL REUS W/TWL XL LVL3 (GOWN DISPOSABLE) ×1
KIT BASIN OR (CUSTOM PROCEDURE TRAY) ×1 IMPLANT
KIT TURNOVER KIT B (KITS) ×1 IMPLANT
MANIFOLD NEPTUNE II (INSTRUMENTS) ×1 IMPLANT
NDL HYPO 25GX1X1/2 BEV (NEEDLE) IMPLANT
NDL HYPO 25X1 1.5 SAFETY (NEEDLE) ×1 IMPLANT
NEEDLE HYPO 25GX1X1/2 BEV (NEEDLE) IMPLANT
NEEDLE HYPO 25X1 1.5 SAFETY (NEEDLE) ×1 IMPLANT
NS IRRIG 1000ML POUR BTL (IV SOLUTION) ×1 IMPLANT
PACK ORTHO EXTREMITY (CUSTOM PROCEDURE TRAY) ×1 IMPLANT
PAD ARMBOARD 7.5X6 YLW CONV (MISCELLANEOUS) ×2 IMPLANT
PAD CAST 4YDX4 CTTN HI CHSV (CAST SUPPLIES) ×2 IMPLANT
PADDING CAST COTTON 4X4 STRL (CAST SUPPLIES) ×2
SPIKE FLUID TRANSFER (MISCELLANEOUS) ×1 IMPLANT
SPONGE T-LAP 18X18 ~~LOC~~+RFID (SPONGE) ×1 IMPLANT
SUT ETHILON 4 0 PS 2 18 (SUTURE) IMPLANT
SUT VICRYL RAPIDE 4/0 PS 2 (SUTURE) IMPLANT
SWAB COLLECTION DEVICE MRSA (MISCELLANEOUS) ×1 IMPLANT
SWAB CULTURE ESWAB REG 1ML (MISCELLANEOUS) IMPLANT
SYR CONTROL 10ML LL (SYRINGE) ×1 IMPLANT
TOWEL GREEN STERILE (TOWEL DISPOSABLE) ×1 IMPLANT
TOWEL GREEN STERILE FF (TOWEL DISPOSABLE) ×1 IMPLANT
TUBE CONNECTING 12X1/4 (SUCTIONS) ×1 IMPLANT
UNDERPAD 30X36 HEAVY ABSORB (UNDERPADS AND DIAPERS) ×1 IMPLANT
YANKAUER SUCT BULB TIP NO VENT (SUCTIONS) ×1 IMPLANT
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
## 2023-01-12 NOTE — Progress Notes (Signed)
CCC Pre-op Review  Pre-op checklist:  per RN Vernona Rieger / completed  NPO:  yes   Labs: k 3.3  addressed w/ hospitalist  Consent: completed  H&P:  needs surgeon to complete  Vitals: wnl  O2 requirements:  RA  MAR/PTA review: ok  IV:  in place  Floor nurse name:  Vernona Rieger RN  Additional info:
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
## 2023-01-12 NOTE — Anesthesia Procedure Notes (Addendum)
Procedure Name: LMA Insertion Date/Time: 01/12/2023 4:42 PM  Performed by: Sharyn Dross, CRNAPre-anesthesia Checklist: Patient identified, Emergency Drugs available, Suction available and Patient being monitored Patient Re-evaluated:Patient Re-evaluated prior to induction Oxygen Delivery Method: Circle system utilized Preoxygenation: Pre-oxygenation with 100% oxygen Induction Type: IV induction Ventilation: Mask ventilation without difficulty LMA: LMA inserted LMA Size: 4.0 Number of attempts: 1 Placement Confirmation: positive ETCO2 and breath sounds checked- equal and bilateral Tube secured with: Tape Dental Injury: Teeth and Oropharynx as per pre-operative assessment
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
## 2023-01-12 NOTE — Anesthesia Postprocedure Evaluation (Signed)
Anesthesia Post Note  Patient: Kysean D Kilman  Procedure(s) Performed: LEFT HAND IRRIGATION AND DEBRIDEMENT,  FIFTH METACARPAL JOINT IRRIGATION AND DEBRIDEMENT (Left)     Patient location during evaluation: PACU Anesthesia Type: General Level of consciousness: awake and alert, patient cooperative and oriented Pain management: pain level controlled (pain improving) Vital Signs Assessment: post-procedure vital signs reviewed and stable Respiratory status: spontaneous breathing, nonlabored ventilation and respiratory function stable Cardiovascular status: blood pressure returned to baseline and stable Postop Assessment: no apparent nausea or vomiting Anesthetic complications: no   There were no known notable events for this encounter.  Last Vitals:  Vitals:   01/12/23 1800 01/12/23 1815  BP: 133/80 129/83  Pulse: 79 66  Resp: 18 20  Temp: (!) 36.3 C   SpO2: 94% 99%    Last Pain:  Vitals:   01/12/23 1820  TempSrc:   PainSc: 6                  Davonne Jarnigan,E. Waunita Sandstrom

## 2023-01-12 NOTE — Transfer of Care (Signed)
Immediate Anesthesia Transfer of Care Note  Patient: Kristopher Miller  Procedure(s) Performed: LEFT HAND IRRIGATION AND DEBRIDEMENT,  FIFTH METACARPAL JOINT IRRIGATION AND DEBRIDEMENT (Left)  Patient Location: PACU  Anesthesia Type:General  Level of Consciousness: awake, alert , and oriented  Airway & Oxygen Therapy: Patient Spontanous Breathing  Post-op Assessment: Report given to RN, Post -op Vital signs reviewed and stable, and Patient moving all extremities X 4  Post vital signs: Reviewed and stable  Last Vitals:  Vitals Value Taken Time  BP 140/79   Temp    Pulse 92 01/12/23 1758  Resp    SpO2 97 % 01/12/23 1758  Vitals shown include unfiled device data.  Last Pain:  Vitals:   01/12/23 1504  TempSrc: Oral  PainSc: 8          Complications: There were no known notable events for this encounter.

## 2023-01-12 NOTE — Anesthesia Preprocedure Evaluation (Addendum)
Anesthesia Evaluation  Patient identified by MRN, date of birth, ID band Patient awake    Reviewed: Allergy & Precautions, NPO status , Patient's Chart, lab work & pertinent test results  History of Anesthesia Complications Negative for: history of anesthetic complications  Airway Mallampati: II  TM Distance: >3 FB Neck ROM: Full    Dental  (+) Dental Advisory Given,    Pulmonary neg shortness of breath, neg sleep apnea, neg COPD, neg recent URI, Current Smoker and Patient abstained from smoking.   Pulmonary exam normal breath sounds clear to auscultation       Cardiovascular negative cardio ROS  Rhythm:Regular Rate:Normal     Neuro/Psych  PSYCHIATRIC DISORDERS (PTSD) Anxiety   Schizophrenia  negative neurological ROS     GI/Hepatic negative GI ROS,,,(+)     substance abuse (reports has not used cocaine in 3 years)  cocaine use and marijuana use  Endo/Other  negative endocrine ROS    Renal/GU negative Renal ROS     Musculoskeletal   Abdominal   Peds  Hematology negative hematology ROS (+) Lab Results      Component                Value               Date                      WBC                      6.9                 01/12/2023                HGB                      12.5 (L)            01/12/2023                HCT                      37.9 (L)            01/12/2023                MCV                      93.8                01/12/2023                PLT                      198                 01/12/2023              Anesthesia Other Findings   Reproductive/Obstetrics                             Anesthesia Physical Anesthesia Plan  ASA: 2  Anesthesia Plan: General   Post-op Pain Management:    Induction: Intravenous  PONV Risk Score and Plan: 1 and Ondansetron, Dexamethasone and Treatment may vary due to age or medical condition  Airway Management Planned:  LMA  Additional Equipment:   Intra-op Plan:   Post-operative  Plan: Extubation in OR  Informed Consent: I have reviewed the patients History and Physical, chart, labs and discussed the procedure including the risks, benefits and alternatives for the proposed anesthesia with the patient or authorized representative who has indicated his/her understanding and acceptance.     Dental advisory given  Plan Discussed with: CRNA and Anesthesiologist  Anesthesia Plan Comments: (Risks of general anesthesia discussed including, but not limited to, sore throat, hoarse voice, chipped/damaged teeth, injury to vocal cords, nausea and vomiting, allergic reactions, lung infection, heart attack, stroke, and death. All questions answered. )       Anesthesia Quick Evaluation

## 2023-01-13 ENCOUNTER — Encounter (HOSPITAL_COMMUNITY): Payer: Self-pay | Admitting: Orthopedic Surgery

## 2023-01-13 DIAGNOSIS — L0390 Cellulitis, unspecified: Secondary | ICD-10-CM | POA: Diagnosis not present

## 2023-01-13 LAB — CBC
HCT: 37 % — ABNORMAL LOW (ref 39.0–52.0)
Hemoglobin: 12.6 g/dL — ABNORMAL LOW (ref 13.0–17.0)
MCH: 32.1 pg (ref 26.0–34.0)
MCHC: 34.1 g/dL (ref 30.0–36.0)
MCV: 94.1 fL (ref 80.0–100.0)
Platelets: 259 10*3/uL (ref 150–400)
RBC: 3.93 MIL/uL — ABNORMAL LOW (ref 4.22–5.81)
RDW: 11.1 % — ABNORMAL LOW (ref 11.5–15.5)
WBC: 8 10*3/uL (ref 4.0–10.5)
nRBC: 0 % (ref 0.0–0.2)

## 2023-01-13 LAB — CK: Total CK: 85 U/L (ref 49–397)

## 2023-01-13 LAB — COMPREHENSIVE METABOLIC PANEL
ALT: 21 U/L (ref 0–44)
AST: 19 U/L (ref 15–41)
Albumin: 2.6 g/dL — ABNORMAL LOW (ref 3.5–5.0)
Alkaline Phosphatase: 52 U/L (ref 38–126)
Anion gap: 8 (ref 5–15)
BUN: 15 mg/dL (ref 6–20)
CO2: 23 mmol/L (ref 22–32)
Calcium: 8.2 mg/dL — ABNORMAL LOW (ref 8.9–10.3)
Chloride: 103 mmol/L (ref 98–111)
Creatinine, Ser: 1.13 mg/dL (ref 0.61–1.24)
GFR, Estimated: >60 mL/min (ref >60)
Glucose, Bld: 274 mg/dL — ABNORMAL HIGH (ref 70–99)
Potassium: 4.4 mmol/L (ref 3.5–5.1)
Sodium: 134 mmol/L — ABNORMAL LOW (ref 135–145)
Total Bilirubin: 0.6 mg/dL (ref 0.3–1.2)
Total Protein: 5.6 g/dL — ABNORMAL LOW (ref 6.5–8.1)

## 2023-01-13 LAB — C-REACTIVE PROTEIN: CRP: 1.3 mg/dL — ABNORMAL HIGH (ref <1.0)

## 2023-01-13 MED ORDER — SODIUM CHLORIDE 0.9 % IV SOLN
8.0000 mg/kg | Freq: Every day | INTRAVENOUS | Status: DC
  Administered 2023-01-13 – 2023-01-14 (×2): 600 mg via INTRAVENOUS
  Filled 2023-01-13 (×3): qty 12

## 2023-01-13 NOTE — Progress Notes (Signed)
Pharmacy Antibiotic Note  Kristopher Miller is a 42 y.o. male admitted on 01/11/2023 with  Wound infection / OM / Septic arthritis .  Pharmacy was initially consulted for Vancomycin and Zosyn dosing, now transition to Daptomycin and Zosyn. Patient is s/p I&D yesterday, with tissue cultures pending. SCr increasing from 0.77 initially to 1.13 today. Discussed with Dr. Natale Milch, will switch to Daptomycin to avoid Vancomycin induced AKI.  Plan: Continue Zosyn 3.375 IV q8h Stop Vancomycin Start Daptomycin 8 mg/kg IV q24h Monitor renal function, WBC, tempurature, and weekly CK. Follow cultures for de-escalation.  Height: 5\' 10"  (177.8 cm) Weight: 76.7 kg (169 lb) IBW/kg (Calculated) : 73  Temp (24hrs), Avg:98.1 F (36.7 C), Min:97.4 F (36.3 C), Max:98.7 F (37.1 C)  Recent Labs  Lab 01/11/23 1715 01/11/23 1946 01/12/23 0224 01/13/23 0333  WBC 9.0  --  6.9 8.0  CREATININE 0.77  --  0.91 1.13  LATICACIDVEN 1.1 0.8  --   --     Estimated Creatinine Clearance: 87.9 mL/min (by C-G formula based on SCr of 1.13 mg/dL).    No Known Allergies  Antimicrobials this admission: Zosyn 8/22 >>  Vancomycin 8/22 >> 8/24 Daptomycin 8/24 >>   Dose adjustments this admission: None  Microbiology results: 8/22 BCx: ngx2d 8/23 Tissue culture: Pending  8/23 MRSA PCR: positive  Thank you for allowing pharmacy to be a part of this patient's care.  Lora Paula, PharmD PGY-2 Infectious Diseases Pharmacy Resident 01/13/2023 11:08 AM
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
## 2023-01-14 DIAGNOSIS — L0390 Cellulitis, unspecified: Secondary | ICD-10-CM | POA: Diagnosis not present

## 2023-01-14 LAB — C-REACTIVE PROTEIN: CRP: 0.9 mg/dL (ref <1.0)

## 2023-01-14 LAB — COMPREHENSIVE METABOLIC PANEL
ALT: 21 U/L (ref 0–44)
AST: 19 U/L (ref 15–41)
Albumin: 2.6 g/dL — ABNORMAL LOW (ref 3.5–5.0)
Alkaline Phosphatase: 63 U/L (ref 38–126)
Anion gap: 5 (ref 5–15)
BUN: 14 mg/dL (ref 6–20)
CO2: 25 mmol/L (ref 22–32)
Calcium: 8.4 mg/dL — ABNORMAL LOW (ref 8.9–10.3)
Chloride: 110 mmol/L (ref 98–111)
Creatinine, Ser: 1.17 mg/dL (ref 0.61–1.24)
GFR, Estimated: >60 mL/min (ref >60)
Glucose, Bld: 197 mg/dL — ABNORMAL HIGH (ref 70–99)
Potassium: 3.9 mmol/L (ref 3.5–5.1)
Sodium: 140 mmol/L (ref 135–145)
Total Bilirubin: 0.5 mg/dL (ref 0.3–1.2)
Total Protein: 5.8 g/dL — ABNORMAL LOW (ref 6.5–8.1)

## 2023-01-14 LAB — CBC
HCT: 36.7 % — ABNORMAL LOW (ref 39.0–52.0)
Hemoglobin: 12 g/dL — ABNORMAL LOW (ref 13.0–17.0)
MCH: 30.8 pg (ref 26.0–34.0)
MCHC: 32.7 g/dL (ref 30.0–36.0)
MCV: 94.3 fL (ref 80.0–100.0)
Platelets: 263 10*3/uL (ref 150–400)
RBC: 3.89 MIL/uL — ABNORMAL LOW (ref 4.22–5.81)
RDW: 11.4 % — ABNORMAL LOW (ref 11.5–15.5)
WBC: 9.7 10*3/uL (ref 4.0–10.5)
nRBC: 0 % (ref 0.0–0.2)

## 2023-01-14 NOTE — Progress Notes (Signed)
Subjective: 2 Days Post-Op Procedure(s) (LRB): LEFT HAND IRRIGATION AND DEBRIDEMENT,  FIFTH METACARPAL JOINT IRRIGATION AND DEBRIDEMENT (Left)  Patient doing well at this time. Pain is reasonably well controlled.  CRP 0.9 today.  Cultures positive for Staph aureus, sensitivities pending.  Objective: Vital signs in last 24 hours: Temp:  [97.6 F (36.4 C)-98.7 F (37.1 C)] 97.6 F (36.4 C) (08/25 0914) Pulse Rate:  [68-79] 69 (08/25 0914) Resp:  [14-18] 18 (08/25 0914) BP: (106-120)/(53-67) 115/67 (08/25 0914) SpO2:  [94 %-100 %] 100 % (08/25 0914)  Intake/Output from previous day: 08/24 0701 - 08/25 0700 In: 840 [P.O.:840] Out: 653 [Urine:653] Intake/Output this shift: No intake/output data recorded.  Recent Labs    01/11/23 1715 01/12/23 0224 01/13/23 0333 01/14/23 0439  HGB 13.7 12.5* 12.6* 12.0*   Recent Labs    01/13/23 0333 01/14/23 0439  WBC 8.0 9.7  RBC 3.93* 3.89*  HCT 37.0* 36.7*  PLT 259 263   Recent Labs    01/13/23 0333 01/14/23 0439  NA 134* 140  K 4.4 3.9  CL 103 110  CO2 23 25  BUN 15 14  CREATININE 1.13 1.17  GLUCOSE 274* 197*  CALCIUM 8.2* 8.4*   No results for input(s): "LABPT", "INR" in the last 72 hours.  Able to wiggle fingers.  Dressings intact.   Assessment/Plan: 2 Days Post-Op Procedure(s) (LRB): LEFT HAND IRRIGATION AND DEBRIDEMENT,  FIFTH METACARPAL JOINT IRRIGATION AND DEBRIDEMENT (Left)  -Start daily dressing changes today with iodoform packing of the larger proximal wound.  -Pending sensitivities for antibiotic finalization.   -Given patient's mental health and social issues, patient is likely unable to appropriately perform daily dressing changes or have a reliable family member to help.  Therefore, he may need daily home health nursing visits for wound care. -Patient will follow up with me in 1 week.   Kristopher Miller 01/14/2023, 1:24 PM
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
## 2023-01-15 DIAGNOSIS — L0390 Cellulitis, unspecified: Secondary | ICD-10-CM | POA: Diagnosis not present

## 2023-01-15 LAB — C-REACTIVE PROTEIN: CRP: 0.6 mg/dL (ref <1.0)

## 2023-01-15 LAB — CBC
HCT: 39.1 % (ref 39.0–52.0)
Hemoglobin: 12.7 g/dL — ABNORMAL LOW (ref 13.0–17.0)
MCH: 31 pg (ref 26.0–34.0)
MCHC: 32.5 g/dL (ref 30.0–36.0)
MCV: 95.4 fL (ref 80.0–100.0)
Platelets: 279 10*3/uL (ref 150–400)
RBC: 4.1 MIL/uL — ABNORMAL LOW (ref 4.22–5.81)
RDW: 11.5 % (ref 11.5–15.5)
WBC: 7.1 10*3/uL (ref 4.0–10.5)
nRBC: 0 % (ref 0.0–0.2)

## 2023-01-15 LAB — COMPREHENSIVE METABOLIC PANEL
ALT: 33 U/L (ref 0–44)
AST: 27 U/L (ref 15–41)
Albumin: 2.7 g/dL — ABNORMAL LOW (ref 3.5–5.0)
Alkaline Phosphatase: 53 U/L (ref 38–126)
Anion gap: 11 (ref 5–15)
BUN: 14 mg/dL (ref 6–20)
CO2: 23 mmol/L (ref 22–32)
Calcium: 8.8 mg/dL — ABNORMAL LOW (ref 8.9–10.3)
Chloride: 103 mmol/L (ref 98–111)
Creatinine, Ser: 0.96 mg/dL (ref 0.61–1.24)
GFR, Estimated: >60 mL/min (ref >60)
Glucose, Bld: 186 mg/dL — ABNORMAL HIGH (ref 70–99)
Potassium: 3.9 mmol/L (ref 3.5–5.1)
Sodium: 137 mmol/L (ref 135–145)
Total Bilirubin: 0.6 mg/dL (ref 0.3–1.2)
Total Protein: 6.1 g/dL — ABNORMAL LOW (ref 6.5–8.1)

## 2023-01-15 MED ORDER — SODIUM CHLORIDE 0.9 % IV SOLN
3.0000 g | Freq: Four times a day (QID) | INTRAVENOUS | Status: DC
  Administered 2023-01-15 – 2023-01-16 (×4): 3 g via INTRAVENOUS
  Filled 2023-01-15 (×4): qty 8

## 2023-01-15 NOTE — TOC Progression Note (Signed)
Transition of Care [facility]) - Progression Note    Patient Details  Name: Kristopher Miller MRN: [id number] Date of Birth: Dec 06, 1980  Transition of [facility]) CM/SW Contact  Huston Foley Jacklynn Ganong, RN Phone Number: 01/15/2023, 11:07 AM  Clinical Narrative:     Case manager spoke with patient's sister [name] who states she is person that relays messages and it is her number that is listed. She provides transportation for patient to his appointments. Patient resides with another family member. CM explained that Home Health Nurse has to have a teachable family member. CM called referral to [facility], Liaison with [facility].    Expected Discharge Plan: Home w Home Health Services Barriers to Discharge: No Barriers Identified  Expected Discharge Plan and Services In-house Referral: NA Discharge Planning Services: CM Consult Post Acute Care Choice: Home Health Living arrangements for the past 2 months: Apartment                 DME Arranged: N/A DME Agency: NA       HH Arranged: RN HH Agency: [facility] ([facility]) Date HH Agency Contacted: 01/15/23 Time HH Agency Contacted: 0945 Representative spoke with at [facility] Agency: Morrie Sheldon   Social Determinants of Health (SDOH) Interventions SDOH Screenings   Tobacco Use: High Risk (01/12/2023)    Readmission Risk Interventions     No data to display
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
## 2023-01-15 NOTE — Plan of Care (Signed)

## 2023-01-15 NOTE — Plan of Care (Signed)
  Problem: Clinical Measurements: Goal: Cardiovascular complication will be avoided Outcome: Progressing   Problem: Nutrition: Goal: Adequate nutrition will be maintained Outcome: Progressing   Problem: Elimination: Goal: Will not experience complications related to urinary retention Outcome: Progressing   Problem: Pain Managment: Goal: General experience of comfort will improve Outcome: Progressing   

## 2023-01-16 DIAGNOSIS — L0390 Cellulitis, unspecified: Secondary | ICD-10-CM | POA: Diagnosis not present

## 2023-01-16 LAB — CBC
HCT: 38.6 % — ABNORMAL LOW (ref 39.0–52.0)
Hemoglobin: 12.7 g/dL — ABNORMAL LOW (ref 13.0–17.0)
MCH: 31.5 pg (ref 26.0–34.0)
MCHC: 32.9 g/dL (ref 30.0–36.0)
MCV: 95.8 fL (ref 80.0–100.0)
Platelets: 288 10*3/uL (ref 150–400)
RBC: 4.03 MIL/uL — ABNORMAL LOW (ref 4.22–5.81)
RDW: 11.5 % (ref 11.5–15.5)
WBC: 7.9 10*3/uL (ref 4.0–10.5)
nRBC: 0.3 % — ABNORMAL HIGH (ref 0.0–0.2)

## 2023-01-16 LAB — C-REACTIVE PROTEIN: CRP: 0.5 mg/dL (ref <1.0)

## 2023-01-16 LAB — COMPREHENSIVE METABOLIC PANEL
ALT: 39 U/L (ref 0–44)
AST: 26 U/L (ref 15–41)
Albumin: 2.8 g/dL — ABNORMAL LOW (ref 3.5–5.0)
Alkaline Phosphatase: 53 U/L (ref 38–126)
Anion gap: 6 (ref 5–15)
BUN: 15 mg/dL (ref 6–20)
CO2: 25 mmol/L (ref 22–32)
Calcium: 8.6 mg/dL — ABNORMAL LOW (ref 8.9–10.3)
Chloride: 106 mmol/L (ref 98–111)
Creatinine, Ser: 1.08 mg/dL (ref 0.61–1.24)
GFR, Estimated: >60 mL/min (ref >60)
Glucose, Bld: 125 mg/dL — ABNORMAL HIGH (ref 70–99)
Potassium: 3.8 mmol/L (ref 3.5–5.1)
Sodium: 137 mmol/L (ref 135–145)
Total Bilirubin: 0.7 mg/dL (ref 0.3–1.2)
Total Protein: 6.3 g/dL — ABNORMAL LOW (ref 6.5–8.1)

## 2023-01-16 MED ORDER — HYDROCODONE-ACETAMINOPHEN 5-325 MG PO TABS: 1.0000 | ORAL_TABLET | Freq: Four times a day (QID) | ORAL | 0 refills | Status: AC | PRN

## 2023-01-16 MED ORDER — AMOXICILLIN-POT CLAVULANATE 875-125 MG PO TABS: 1.0000 | ORAL_TABLET | Freq: Two times a day (BID) | ORAL | 0 refills | Status: AC

## 2023-01-16 NOTE — TOC Transition Note (Signed)
Transition of Care (TOC) - CM/SW Discharge Note Donn Pierini RN, BSN Transitions of Care Unit 4E- RN Case Manager See Treatment Team for direct phone # Cross coverage for 5N  Patient Details  Name: Kristopher Miller MRN: [id number] Date of Birth: 1980-10-30  Transition of Care [facility]) CM/SW Contact:  Darrold Span, RN Phone Number: 01/16/2023, 12:05 PM   Clinical Narrative:    Pt stable for transition home today, [facility] RN has been confirmed with [facility] liaison- Morrie Sheldon- however w/ staffing they are committing to 2x week visits.   Cm has requested bedside RN to provide wound care drsg education to pt/sister when sister arrives to transport pt home.   CM has spoken with sister and per sister pt has support at home by family- sister confirms that someone will be available to assist pt with needed drsg changes. Explained to sister that [facility] will be doing 2x week visits- address [place]'s address- sister confirmed that her phone will be the contact phone for scheduling HH needs. Sister also voiced that she is not sure pt has PCP- but will f/u.   Per Ortho note- pt to f/u in their office in a week.   [facility] liaison updated for [facility] RN wound care.    Final next level of care: Home w Home Health Services Barriers to Discharge: No Barriers Identified   Patient Goals and CMS Choice   Choice offered to / list presented to : Sibling  Discharge Placement                 Home w/ [facility]        Discharge Plan and Services Additional resources added to the After Visit Summary for   In-house Referral: NA Discharge Planning Services: CM Consult Post Acute Care Choice: Home Health          DME Arranged: N/A DME Agency: NA       HH Arranged: RN HH Agency: [facility] Date HH Agency Contacted: 01/15/23 Time HH Agency Contacted: 0945 Representative spoke with at [facility] Agency: Morrie Sheldon  Social Determinants of Health (SDOH) Interventions SDOH  Screenings   Tobacco Use: High Risk (01/12/2023)     Readmission Risk Interventions    01/16/2023   12:05 PM  Readmission Risk Prevention Plan  Post Dischage Appt Complete  Medication Screening Complete  Transportation Screening Complete

## 2023-01-16 NOTE — Plan of Care (Signed)
  Problem: Health Behavior/Discharge Planning: Goal: Ability to manage health-related needs will improve Outcome: Progressing   Problem: Clinical Measurements: Goal: Ability to maintain clinical measurements within normal limits will improve Outcome: Progressing   Problem: Activity: Goal: Risk for activity intolerance will decrease Outcome: Progressing   

## 2023-01-16 NOTE — Plan of Care (Signed)
  Problem: Education: Goal: Knowledge of General Education information will improve Description: Including pain rating scale, medication(s)/side effects and non-pharmacologic comfort measures Outcome: Progressing   Problem: Clinical Measurements: Goal: Will remain free from infection Outcome: Progressing   
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
## 2023-01-16 NOTE — Progress Notes (Signed)
Subjective: 4 Days Post-Op Procedure(s) (LRB): LEFT HAND IRRIGATION AND DEBRIDEMENT,  FIFTH METACARPAL JOINT IRRIGATION AND DEBRIDEMENT (Left)  Doing fairly well today.  CRP normal for last few days.  Was de-escalated to Unasyn yesterday and no fevers overnight.  Home health RN has been set up for 3x per week visits for dressing changes.  Objective: Vital signs in last 24 hours: Temp:  [97.8 F (36.6 C)-99 F (37.2 C)] 97.8 F (36.6 C) (08/27 0721) Pulse Rate:  [63-79] 63 (08/27 0721) Resp:  [16-19] 18 (08/27 0721) BP: (103-125)/(48-83) 107/83 (08/27 0721) SpO2:  [93 %-97 %] 96 % (08/27 0721)  Intake/Output from previous day: 08/26 0701 - 08/27 0700 In: 1081.5 [P.O.:1080; IV Piggyback:1.5] Out: 1800 [Urine:1800] Intake/Output this shift: No intake/output data recorded.  Recent Labs    01/14/23 0439 01/15/23 0516 01/16/23 0503  HGB 12.0* 12.7* 12.7*   Recent Labs    01/15/23 0516 01/16/23 0503  WBC 7.1 7.9  RBC 4.10* 4.03*  HCT 39.1 38.6*  PLT 279 288   Recent Labs    01/15/23 0516 01/16/23 0503  NA 137 137  K 3.9 3.8  CL 103 106  CO2 23 25  BUN 14 15  CREATININE 0.96 1.08  GLUCOSE 186* 125*  CALCIUM 8.8* 8.6*   No results for input(s): "LABPT", "INR" in the last 72 hours.  Iodoform packing removed.  Healthy tissue at base.  Unable to extend small finger MCP joint.     Assessment/Plan: 4 Days Post-Op Procedure(s) (LRB): LEFT HAND IRRIGATION AND DEBRIDEMENT,  FIFTH METACARPAL JOINT IRRIGATION AND DEBRIDEMENT (Left)  Plan to d/c with home health RN for dressing changes.  With PO antibiotics.  Will have patient follow up in my clinic in 1 week. Daily iodoform packing dressing changes.   Edsel Petrin Brannen Koppen 01/16/2023, 8:48 AM

## 2023-01-17 LAB — CULTURE, BLOOD (ROUTINE X 2)
Culture: NO GROWTH
Culture: NO GROWTH
Special Requests: ADEQUATE

## 2023-01-17 LAB — AEROBIC/ANAEROBIC CULTURE W GRAM STAIN (SURGICAL/DEEP WOUND): Gram Stain: NONE SEEN

## 2023-01-23 ENCOUNTER — Encounter: Payer: Self-pay | Admitting: Orthopedic Surgery
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
## 2023-02-19 ENCOUNTER — Ambulatory Visit: Payer: Medicaid Other | Attending: Orthopedic Surgery | Admitting: Occupational Therapy
